# Patient Record
Sex: Female | Born: 1975 | Race: White | Hispanic: No | Marital: Married | Smoking: Former smoker
Health system: Southern US, Community
[De-identification: ages and names within clinical notes are randomized; demographics above are authoritative.]

## PROBLEM LIST (undated history)

## (undated) DIAGNOSIS — G43909 Migraine, unspecified, not intractable, without status migrainosus: Secondary | ICD-10-CM

## (undated) DIAGNOSIS — K219 Gastro-esophageal reflux disease without esophagitis: Secondary | ICD-10-CM

## (undated) DIAGNOSIS — G901 Familial dysautonomia [Riley-Day]: Secondary | ICD-10-CM

## (undated) HISTORY — DX: Gastro-esophageal reflux disease without esophagitis: K21.9

## (undated) HISTORY — PX: TONSILLECTOMY: SUR1361

## (undated) HISTORY — DX: Familial dysautonomia (riley-day): G90.1

## (undated) HISTORY — PX: ENDOMETRIAL ABLATION: SHX621

---

## 2007-09-30 ENCOUNTER — Ambulatory Visit: Payer: Self-pay | Admitting: Internal Medicine

## 2011-04-28 DIAGNOSIS — K219 Gastro-esophageal reflux disease without esophagitis: Secondary | ICD-10-CM | POA: Insufficient documentation

## 2011-04-28 DIAGNOSIS — G43009 Migraine without aura, not intractable, without status migrainosus: Secondary | ICD-10-CM | POA: Insufficient documentation

## 2011-04-28 DIAGNOSIS — M533 Sacrococcygeal disorders, not elsewhere classified: Secondary | ICD-10-CM | POA: Insufficient documentation

## 2014-01-26 DIAGNOSIS — Z7689 Persons encountering health services in other specified circumstances: Secondary | ICD-10-CM | POA: Insufficient documentation

## 2014-02-03 DIAGNOSIS — Z720 Tobacco use: Secondary | ICD-10-CM | POA: Insufficient documentation

## 2014-02-03 DIAGNOSIS — R079 Chest pain, unspecified: Secondary | ICD-10-CM | POA: Insufficient documentation

## 2016-06-16 DIAGNOSIS — M545 Low back pain, unspecified: Secondary | ICD-10-CM | POA: Insufficient documentation

## 2017-07-14 DIAGNOSIS — M25861 Other specified joint disorders, right knee: Secondary | ICD-10-CM | POA: Insufficient documentation

## 2020-01-10 ENCOUNTER — Ambulatory Visit
Admission: EM | Admit: 2020-01-10 | Discharge: 2020-01-10 | Disposition: A | Discharge status: Home / Self Care | Payer: 59 | Attending: Urgent Care | Admitting: Urgent Care

## 2020-01-10 ENCOUNTER — Other Ambulatory Visit: Payer: Self-pay

## 2020-01-10 ENCOUNTER — Ambulatory Visit (INDEPENDENT_AMBULATORY_CARE_PROVIDER_SITE_OTHER): Payer: 59

## 2020-01-10 DIAGNOSIS — M25561 Pain in right knee: Secondary | ICD-10-CM

## 2020-01-10 DIAGNOSIS — W108XXA Fall (on) (from) other stairs and steps, initial encounter: Secondary | ICD-10-CM

## 2020-01-10 DIAGNOSIS — M25461 Effusion, right knee: Secondary | ICD-10-CM

## 2020-01-10 HISTORY — DX: Migraine, unspecified, not intractable, without status migrainosus: G43.909

## 2020-01-10 MED ORDER — HYDROCODONE-ACETAMINOPHEN 5-325 MG PO TABS: 1.0000 | ORAL_TABLET | Freq: Three times a day (TID) | ORAL | 0 refills | Status: DC | PRN

## 2020-01-10 MED ORDER — MELOXICAM 15 MG PO TABS: 15.0000 mg | ORAL_TABLET | Freq: Every day | ORAL | 0 refills | Status: DC

## 2020-01-10 NOTE — Discharge Instructions (Addendum)
It was very nice seeing you today in clinic. Thank you for entrusting me with your care.   Rest, ice, and elevate your knee. Wear brace and use crutches. Limit weight bearing as much as possible until advised otherwise by orthopedics.   Make arrangements to follow up with orthopedics doctor continuing care and treatment. I have provided you the name and office contact information for an excellent local provider [facility] provider). If your symptoms/condition worsens, please seek follow up care either here or in the ER. Please remember, our [facility] providers are "right here with you" when you need [place].   Again, it was my pleasure to take care of you today. Thank you for choosing our clinic. I hope that you start to feel better quickly.   Quentin Mulling, MSN, APRN, FNP-C, CEN Advanced Practice Provider [facility] [place] [facility]

## 2020-01-10 NOTE — ED Triage Notes (Signed)
Missed a stair and fell.  R knee pain going down leg.  Stabbing and tingling.  Did not hit head, no LOC. No other pain, injuries.

## 2020-01-11 NOTE — ED Provider Notes (Signed)
[place], [place]   Name: Tina Chaney DOB: June 16, 1976 MRN: [id number] CSN: [id number] PCP: Patient, No Pcp Per  Arrival date and time:  01/10/20 1938  Chief Complaint:  Knee Pain (right)  NOTE: Prior to seeing the patient today, I have reviewed the triage nursing documentation and vital signs. Clinical staff has updated patient's PMH/PSHx, current medication list, and drug allergies/intolerances to ensure comprehensive history available to assist in medical decision making.   History:   HPI: Tina Chaney is a 44 y.o. female who presents today with complaints of pain in her knee following a mechanical fall that occurred just PTA. Patient describes that the incident occurred when she was carrying a horse that her mother had painted down the stairs. She notes that the lights were off and it was dark. Patient missed the last step resulting in her falling to the floor. When she fell, patient advising that she injured her knee via a twisting motion. Patient does not report hearing/feeling any sort of pop when she fell. She state, "my Apple watch even went off as a notification of a hard fall". Patient denies hitting her head when she fell; no LOC. PMH is not significant for any previous knee injuries or surgeries. Patient presents with generalized pain and swelling in her knee. She describes a distally radiating pain from the knee into the lower extremity that is sharp and stabbing in nature. Due to the acute nature of the events leading to today's urgent care visit, patient has not taken any over the counter interventions to help with her pain.   Past Medical History:  Diagnosis Date  . Migraines     Past Surgical History:  Procedure Laterality Date  . ENDOMETRIAL ABLATION    . TONSILLECTOMY      Family History  Problem Relation Age of Onset  . Diabetes Father   . Renal Disease Father     Social History   Tobacco Use  . Smoking status: Never Smoker  . Smokeless tobacco: Never  Used  Substance Use Topics  . Alcohol use: Never  . Drug use: Never    There are no problems to display for this patient.   Home Medications:    Current Meds  Medication Sig  . Galcanezumab-gnlm 120 MG/ML SOSY Inject into the skin every 30 (thirty) days.  . SUMAtriptan (IMITREX) 100 MG tablet Take 100 mg by mouth every 2 (two) hours as needed for migraine. May repeat in 2 hours if headache persists or recurs.    Allergies:   Patient has no known allergies.  Review of Systems (ROS):  Review of systems NEGATIVE unless otherwise noted in narrative H&P section.   Vital Signs: Today's Vitals   01/10/20 2001 01/10/20 2006 01/10/20 2036  BP:  (!) 128/108   Pulse:  (!) 106   Resp:  18   Temp:  98.1 F (36.7 C)   TempSrc:  Oral   SpO2:  100%   PainSc: 9   9     Physical Exam: Physical Exam  Constitutional: She is oriented to person, place, and time. She appears distressed (mild 2/2 acute pain).  HENT:  Head: Normocephalic and atraumatic.  Eyes: Pupils are equal, round, and reactive to light.  Cardiovascular: Intact distal pulses. Tachycardia present.  Pulmonary/Chest: Effort normal. No respiratory distress.  Musculoskeletal:     Right knee: Swelling and effusion present. No deformity or ecchymosis. Decreased range of motion. Tenderness (generalized) present. No LCL laxity or MCL laxity. Normal alignment  and normal patellar mobility.     Comments: (+) PMS noted distally; color, temperature, and capillary refill all WNL.   Neurological: She is alert and oriented to person, place, and time. She has normal sensation, normal strength and normal reflexes. Gait (2/2 acute injury) abnormal.  Skin: Skin is warm and dry. No rash noted. She is not diaphoretic.  Psychiatric: Memory, affect and judgment normal. Her mood appears anxious (2/2 acute pain).  Nursing note and vitals reviewed.   Urgent Care Treatments / Results:   Orders Placed This Encounter  Procedures  . DG Knee  Complete 4 Views Right    LABS: PLEASE NOTE: all labs that were ordered this encounter are listed, however only abnormal results are displayed. Labs Reviewed - No data to display  EKG: -None  RADIOLOGY: DG Knee Complete 4 Views Right  Result Date: 01/10/2020 CLINICAL DATA:  Twisting injury, swelling, fell EXAM: RIGHT KNEE - COMPLETE 4+ VIEW COMPARISON:  None. FINDINGS: Frontal, bilateral oblique, lateral views of the right knee are obtained. Mild medial compartmental joint space narrowing. No fracture, subluxation, or dislocation. Large suprapatellar joint effusion. Remaining soft tissues are unremarkable. IMPRESSION: 1. Large joint effusion. 2. Mild medial compartmental joint space narrowing. Electronically Signed   By: Sharlet Salina M.D.   On: 01/10/2020 19:57    PROCEDURES: Procedures  MEDICATIONS RECEIVED THIS VISIT: Medications - No data to display  PERTINENT CLINICAL COURSE NOTES/UPDATES:   Initial Impression / Assessment and Plan / Urgent Care Course:  Pertinent labs & imaging results that were available during my care of the patient were personally reviewed by me and considered in my medical decision making (see lab/imaging section of note for values and interpretations).  Tina Chaney is a 44 y.o. female who presents to [facility] today with complaints of Knee Pain (right)  Patient is well appearing overall in clinic today. She does not appear to be in any acute distress. Presenting symptoms (see HPI) and exam as documented above. Exam reveals painful ROM in the RIGHT knee following accidental fall. No deformities notes. Patient grossly NVI. Diagnostic radiographs of the RIGHT knee revealed no acute fracture or dislocation. There is a large suprapatellar effusion noted on the plain films. Discussed with patient that the absence of osseous injuries does not rule out a more serious problem of a ligamentous or cartilaginous etiology. Discussed plan of care for  treatment of her acute knee injury as follows:   Patient placed in reactive knee sleeve in order to provide stability and support. Discussed to limit weight bearing. Patient provided with crutches.    Will pursue treatment using anti-inflammatory medication (meloxicam 15 mg) daily.     She was educated on complimentary modalities to help with her pain. Patient encouraged to rest, ice, and elevate the knee as much as possible. Advised that ice should be applied at lead TID-QID for 15-20 minutes at a time.    Pain is severe and stands to continue to worsen as patient performs her normal ADL/IADLs. Will provide a short course of Norco 5/325 mg tablets for PRN use for more significant pain. Indications and side effects discussed.    Patient needs to be seen for further evaluation by orthopedics. Patient is going to require further treatment for her knee, which may include +/- additional imaging (MRI) and +/- arthrocentesis as deemed appropriate by her orthopedic specialists.  Name and office contact information provided on today's AVS for Dr. Kennedy Bucker. Patient advised the she will need to  contact the office to schedule an appointment to be seen.   Current clinical condition warrants patient being out of work in order to recover from her current injury/illness. 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 Her role undoubtedly requires her to do a great deal of ambulating in order to care for her patients. With her current pain level, coupled with the ordered knee brace and crutches, this is simply not possible. She was provided with the appropriate documentation to provide to her place of employment that will allow for her to RTW on 01/16/2020. Additional time off and/or RTW with restrictions may be required based on recovery from her current injury. Need for additional time or RTW restrictions to be determined by orthopedic provider at time of consult.    I have  reviewed the follow up and strict return precautions for any new or worsening symptoms. Patient is aware of symptoms that would be deemed urgent/emergent, and would thus require further evaluation either here or in the emergency department. At the time of discharge, she verbalized understanding and consent with the discharge plan as it was reviewed with her. All questions were fielded by provider and/or clinic staff prior to patient discharge.    Final Clinical Impressions / Urgent Care Diagnoses:   Final diagnoses:  Acute pain of right knee  Knee effusion, right  Fall (on) (from) other stairs and steps, initial encounter    New Prescriptions:  [place] Controlled Substance Registry consulted? Yes, I have consulted the [place] Controlled Substances Registry for this patient, and feel the risk/benefit ratio today is favorable for proceeding with this prescription for a controlled substance.  . Discussed use of controlled substance medication to treat her acute pain.  o Reviewed [place] STOP Act regulations  o Clinic does not refill controlled substances over the phone without face to face evaluation.  . Safety precautions reviewed.  o Medications should not be bitten, chewed, sold, or taken with alcohol.  o Avoid use while working, driving, or operating heavy machinery.  o Side effects associated with the use of this particular medication reviewed. - Patient understands that this medication can cause CNS depression, increase her risk of falls, and even lead to overdose that may result in death, if used outside of the parameters that she and I discussed.  With all of this in mind, she knowingly accepts the risks and responsibilities associated with intended course of treatment, and elects to responsibly proceed as discussed.  Meds ordered this encounter  Medications  . meloxicam (MOBIC) 15 MG tablet    Sig: Take 1 tablet (15 mg total) by mouth daily.    Dispense:  30 tablet    Refill:  0  .  HYDROcodone-acetaminophen (NORCO) 5-325 MG tablet    Sig: Take 1 tablet by mouth 3 (three) times daily as needed for moderate pain.    Dispense:  12 tablet    Refill:  0    Recommended Follow up Care:  Patient encouraged to follow up with the following provider within the specified time frame, or sooner as dictated by the severity of her symptoms. As always, she was instructed that for any urgent/emergent care needs, she should seek care either here or in the emergency department for more immediate evaluation.  Follow-up Information    Call  Hessie Knows, MD.   Specialty: Orthopedic Surgery Why: Need to be seen for further evaluation and possible drainage of effusion. Contact information: [facility] [address] [address] [phone]         NOTE: This note was prepared using Dragon dictation software along with smaller phrase technology. Despite my best ability to proofread, there is the potential that transcriptional errors may still occur from this process, and are completely unintentional.    Verlee Monte, NP 01/11/20 2024

## 2020-01-12 DIAGNOSIS — M25561 Pain in right knee: Secondary | ICD-10-CM | POA: Insufficient documentation

## 2020-01-16 DIAGNOSIS — M1711 Unilateral primary osteoarthritis, right knee: Secondary | ICD-10-CM | POA: Insufficient documentation

## 2020-02-06 DIAGNOSIS — S82124D Nondisplaced fracture of lateral condyle of right tibia, subsequent encounter for closed fracture with routine healing: Secondary | ICD-10-CM | POA: Insufficient documentation

## 2021-02-12 ENCOUNTER — Ambulatory Visit
Admission: EM | Admit: 2021-02-12 | Discharge: 2021-02-12 | Disposition: A | Discharge status: Home / Self Care | Payer: BLUE CROSS/BLUE SHIELD | Attending: Sports Medicine | Admitting: Sports Medicine

## 2021-02-12 ENCOUNTER — Other Ambulatory Visit: Payer: Self-pay

## 2021-02-12 DIAGNOSIS — M545 Low back pain, unspecified: Secondary | ICD-10-CM

## 2021-02-12 DIAGNOSIS — M62830 Muscle spasm of back: Secondary | ICD-10-CM

## 2021-02-12 DIAGNOSIS — M259 Joint disorder, unspecified: Secondary | ICD-10-CM | POA: Diagnosis not present

## 2021-02-12 MED ORDER — CYCLOBENZAPRINE HCL 10 MG PO TABS: 10.0000 mg | ORAL_TABLET | Freq: Two times a day (BID) | ORAL | 0 refills | Status: DC | PRN

## 2021-02-12 MED ORDER — PREDNISONE 10 MG (21) PO TBPK: ORAL_TABLET | Freq: Every day | ORAL | 0 refills | Status: DC

## 2021-02-12 NOTE — ED Triage Notes (Signed)
Pt c/o lower back pain for the last 2 1/2 weeks ago, worse on left side. No known injury, no urinary symptoms.

## 2021-02-12 NOTE — Discharge Instructions (Signed)
Please see educational handouts 

## 2021-02-13 NOTE — ED Provider Notes (Signed)
[facility]    CSN: [id number] Arrival date & time: 02/12/21  0626      History   Chief Complaint Chief Complaint  Patient presents with  . Back Pain    HPI Tina Chaney is a 45 y.o. female.   Patient is a pleasant 45 year old female who presents for evaluation of the above issue.  She normally sees [facility] here in [place] but they were unavailable to see her today.  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  She reports 2 to 2-1/2 weeks of left-sided low back pain.  Points over the SI joint as the point of maximal tenderness.  She does have a history of sciatica but it is not going down her leg.  No incontinence of bowel or bladder.  No saddle anesthesia.  She denies accidents trauma falls or twists.  She has been using Aleve ibuprofen or Tylenol as needed.  She does have a history of migraine headaches and takes Imitrex as needed.  She denies any abdominal or urinary symptoms.  She denies any thoracic back pain or cervical pain.  Again no pain going down her legs or arms.  She is not dropping any objects.  No weakness or foot drop.  No chest pain or shortness of breath.  No red flag signs or symptoms elicited on history.     Past Medical History:  Diagnosis Date  . Migraines     There are no problems to display for this patient.   Past Surgical History:  Procedure Laterality Date  . ENDOMETRIAL ABLATION    . TONSILLECTOMY      OB History   No obstetric history on file.      Home Medications    Prior to Admission medications   Medication Sig Start Date End Date Taking? Authorizing Provider  cyclobenzaprine (FLEXERIL) 10 MG tablet Take 1 tablet (10 mg total) by mouth 2 (two) times daily as needed for muscle spasms. 02/12/21  Yes Delton See, MD  predniSONE (STERAPRED UNI-PAK 21 TAB) 10 MG (21) TBPK tablet Take by mouth daily. Take 6 tabs by mouth daily  for 2 days, then 5 tabs for 2 days, then 4 tabs for 2 days, then 3 tabs  for 2 days, 2 tabs for 2 days, then 1 tab by mouth daily for 2 days 02/12/21  Yes Delton See, MD  SUMAtriptan (IMITREX) 100 MG tablet Take 100 mg by mouth every 2 (two) hours as needed for migraine. May repeat in 2 hours if headache persists or recurs.   Yes [provider]  Galcanezumab-gnlm 120 MG/ML SOSY Inject into the skin every 30 (thirty) days.    [provider]  HYDROcodone-acetaminophen (NORCO) 5-325 MG tablet Take 1 tablet by mouth 3 (three) times daily as needed for moderate pain. 01/10/20   Verlee Monte, NP  meloxicam (MOBIC) 15 MG tablet Take 1 tablet (15 mg total) by mouth daily. 01/10/20   Verlee Monte, NP    Family History Family History  Problem Relation Age of Onset  . Diabetes Father   . Renal Disease Father     Social History Social History   Tobacco Use  . Smoking status: Never Smoker  . Smokeless tobacco: Never Used  Substance Use Topics  . Alcohol use: Never  . Drug use: Never     Allergies   Patient has no known allergies.   Review of Systems Review of Systems  Constitutional: Positive for activity  change. Negative for appetite change, chills, diaphoresis, fatigue and fever.  HENT: Negative.  Negative for congestion.   Eyes: Negative.  Negative for pain.  Respiratory: Negative.  Negative for cough.   Cardiovascular: Negative.  Negative for chest pain and palpitations.  Gastrointestinal: Negative for abdominal pain, diarrhea, nausea and vomiting.  Genitourinary: Negative for dysuria, flank pain, frequency, hematuria and urgency.  Musculoskeletal: Positive for back pain and gait problem. Negative for arthralgias, myalgias, neck pain and neck stiffness.  Skin: Negative.  Negative for color change, pallor, rash and wound.  Neurological: Negative for dizziness, syncope, weakness, light-headedness, numbness and headaches.  All other systems reviewed and are negative.    Physical Exam Triage Vital Signs ED Triage Vitals  Enc  Vitals Group     BP 02/12/21 0936 135/65     Pulse Rate 02/12/21 0936 97     Resp 02/12/21 0936 16     Temp 02/12/21 0936 98.2 F (36.8 C)     Temp Source 02/12/21 0936 Oral     SpO2 02/12/21 0936 100 %     Weight 02/12/21 0936 180 lb (81.6 kg)     Height 02/12/21 0936 5\' 8"  (1.727 m)     Head Circumference --      Peak Flow --      Pain Score 02/12/21 0935 7     Pain Loc --      Pain Edu? --      Excl. in GC? --    No data found.  Updated Vital Signs BP 135/65 (BP Location: Left Arm)   Pulse 97   Temp 98.2 F (36.8 C) (Oral)   Resp 16   Ht 5\' 8"  (1.727 m)   Wt 81.6 kg   SpO2 100%   BMI 27.37 kg/m   Visual Acuity Right Eye Distance:   Left Eye Distance:   Bilateral Distance:    Right Eye Near:   Left Eye Near:    Bilateral Near:     Physical Exam Vitals and nursing note reviewed.  Constitutional:      General: She is not in acute distress.    Appearance: Normal appearance. She is not ill-appearing, toxic-appearing or diaphoretic.  HENT:     Head: Normocephalic and atraumatic.     Nose: Nose normal.     Mouth/Throat:     Mouth: Mucous membranes are moist.  Eyes:     General: No scleral icterus.       Right eye: No discharge.        Left eye: No discharge.     Conjunctiva/sclera: Conjunctivae normal.  Cardiovascular:     Rate and Rhythm: Normal rate and regular rhythm.     Pulses: Normal pulses.     Heart sounds: Normal heart sounds. No murmur heard. No friction rub. No gallop.   Pulmonary:     Effort: Pulmonary effort is normal.     Breath sounds: Normal breath sounds. No stridor. No wheezing, rhonchi or rales.  Musculoskeletal:     Cervical back: Normal.     Thoracic back: Normal.     Lumbar back: Spasms and tenderness present. No swelling, edema, deformity, signs of trauma, lacerations or bony tenderness. Decreased range of motion. Negative right straight leg raise test and negative left straight leg raise test. No scoliosis.     Comments: Lumbar  spine: No obvious bony abnormality, ecchymosis, erythema.  No soft tissue swelling.  There is tenderness palpation over both SI joints but more pronounced on  the left side.  Negative straight leg raise bilaterally.  Patient is uncomfortable lifting her left leg and extending her hip.  That said there is no weakness bilateral lower extremities.  Some mild decreased range of motion in all planes secondary to discomfort.  Patient actually feels better with back extension.  There is some mild spasm in the paraspinous muscles of the lumbar spine bilaterally.  2+ deep tendon reflexes at the Achilles and patellar tendon bilaterally.  There is no focal weakness bilateral lower extremities.  Skin:    General: Skin is warm and dry.     Capillary Refill: Capillary refill takes less than 2 seconds.     Findings: No bruising, erythema, lesion or rash.  Neurological:     General: No focal deficit present.     Mental Status: She is alert and oriented to person, place, and time.     Sensory: No sensory deficit.     Motor: No weakness.     Coordination: Coordination normal.     Deep Tendon Reflexes: Reflexes normal.      UC Treatments / Results  Labs (all labs ordered are listed, but only abnormal results are displayed) Labs Reviewed - No data to display  EKG   Radiology No results found.  Procedures Procedures (including critical care time)  Medications Ordered in UC Medications - No data to display  Initial Impression / Assessment and Plan / UC Course  I have reviewed the triage vital signs and the nursing notes.  Pertinent labs & imaging results that were available during my care of the patient were reviewed by me and considered in my medical decision making (see chart for details).  Clinical impression: 45 year old female with 2 to 2-1/2 weeks of left-sided low back pain without sciatica.  She also has some involvement of the left sacroiliac joint consistent with sacroiliitis.  She does  have a history of sciatica but no current symptoms.  She also has some muscle spasm noted on examination.  No red flag signs or symptoms elicited on history or physical examination.  Treatment plan: 1.  The findings and treatment plan were discussed in detail with the patient.  Patient was in agreement. 2.  I reassured her that her exam did not concern me for anything that required a higher level of care immediately which would include an ER visit or advanced imaging.  In addition her exam was consistent with mechanical low back pain.  X-rays were not indicated at the present time.  There was no trauma.  She was in agreement with this. 3.  I went ahead and prescribed a prednisone taper to his sister as well as cyclobenzaprine for the muscle spasm. 4.  Educational handouts provided. 5.  I felt she would benefit from seeing orthopedics and I have referred her to [facility] here in [facility] for them to see her in the office. 6.  She probably would benefit from physical therapy as well but that would need to come from orthopedics or her primary care provider as her insurance would not approve a physical therapy referral from urgent care. 7.  We discussed potential chiropractic evaluation as well and she will look into that on her own. 8.  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 9.  Red flag signs and symptoms were discussed in detail and when to seek out immediate medical attention.  She voiced verbal understanding. 10.  She was discharged from care at  this time and she will follow-up with [place] as needed.  She was stable on discharge.    Final Clinical Impressions(s) / UC Diagnoses   Final diagnoses:  Acute left-sided low back pain without sciatica  Muscle spasm of back  Disorder of left sacroiliac joint     Discharge Instructions     Please see educational handouts    ED Prescriptions    Medication Sig Dispense Auth. Provider   predniSONE (STERAPRED  UNI-PAK 21 TAB) 10 MG (21) TBPK tablet Take by mouth daily. Take 6 tabs by mouth daily  for 2 days, then 5 tabs for 2 days, then 4 tabs for 2 days, then 3 tabs for 2 days, 2 tabs for 2 days, then 1 tab by mouth daily for 2 days 42 tablet Delton See, MD   cyclobenzaprine (FLEXERIL) 10 MG tablet Take 1 tablet (10 mg total) by mouth 2 (two) times daily as needed for muscle spasms. 20 tablet Delton See, MD     PDMP not reviewed this encounter.   Delton See, MD 02/15/21 1651

## 2021-05-20 IMAGING — CR DG KNEE COMPLETE 4+V*R*
4 series · 4 of 4 positions shown · non-contrast
Comparison: None.

CLINICAL DATA: Twisting injury, swelling, fell

EXAM:
RIGHT KNEE - COMPLETE 4+ VIEW

[knee ap]
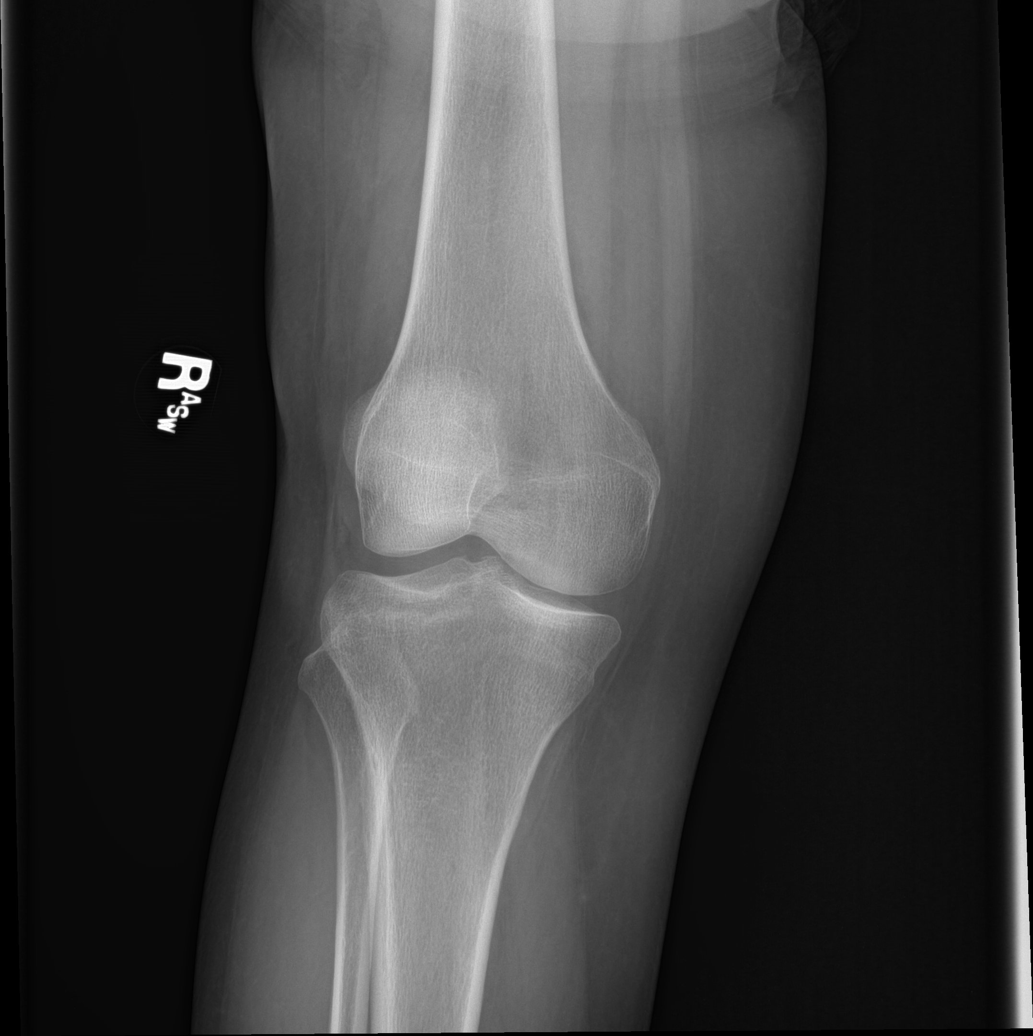

[knee lat]
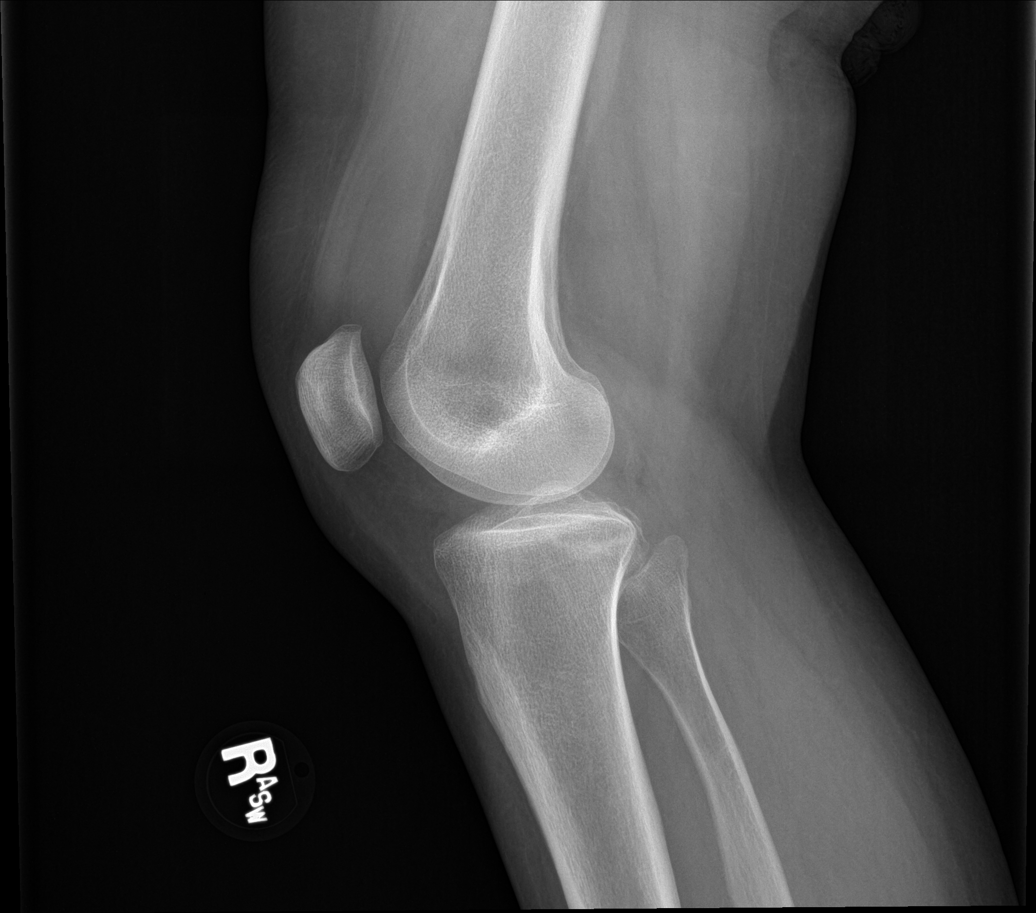

[knee obl (1 of 2)]
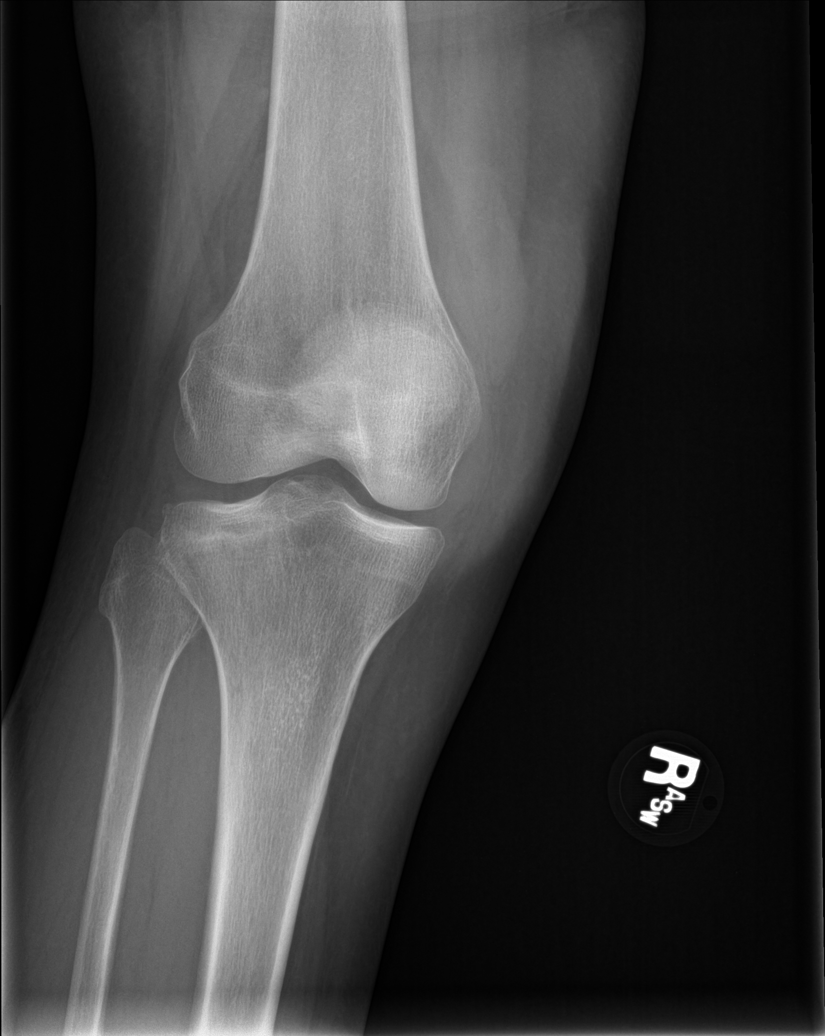

[knee obl (2 of 2)]
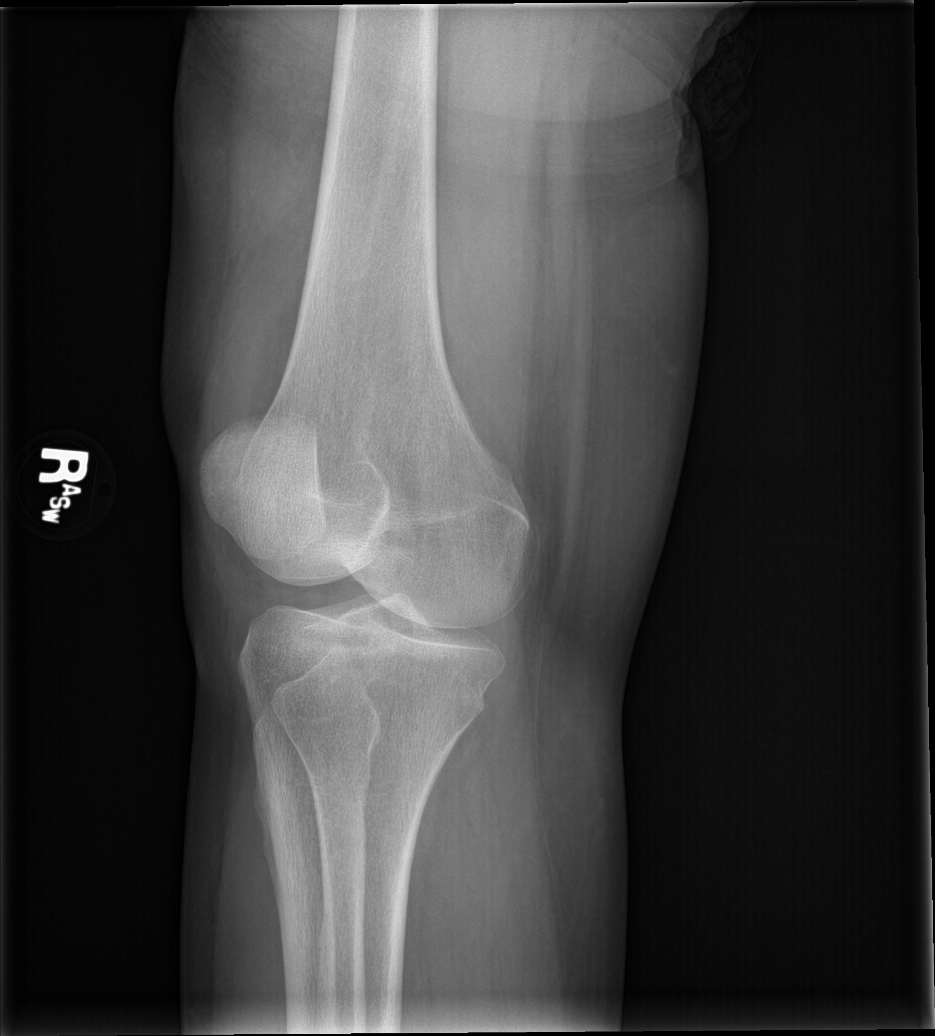

[4 of 4 positions shown; findings below may reference images not displayed]

FINDINGS: Frontal, bilateral oblique, lateral views of the right knee are
obtained. Mild medial compartmental joint space narrowing. No
fracture, subluxation, or dislocation. Large suprapatellar joint
effusion. Remaining soft tissues are unremarkable.
IMPRESSION: 1. Large joint effusion.
2. Mild medial compartmental joint space narrowing.

## 2021-07-11 ENCOUNTER — Ambulatory Visit
Admission: RE | Admit: 2021-07-11 | Discharge: 2021-07-11 | Disposition: A | Discharge status: Home / Self Care | Payer: BLUE CROSS/BLUE SHIELD | Source: Ambulatory Visit | Attending: Emergency Medicine | Admitting: Emergency Medicine

## 2021-07-11 ENCOUNTER — Other Ambulatory Visit: Payer: Self-pay

## 2021-07-11 VITALS — BP 165/103 | HR 89 | Temp 98.3°F | Resp 18 | Ht 68.0 in | Wt 170.0 lb

## 2021-07-11 DIAGNOSIS — H66003 Acute suppurative otitis media without spontaneous rupture of ear drum, bilateral: Secondary | ICD-10-CM | POA: Diagnosis not present

## 2021-07-11 DIAGNOSIS — J069 Acute upper respiratory infection, unspecified: Secondary | ICD-10-CM

## 2021-07-11 MED ORDER — AMOXICILLIN-POT CLAVULANATE 875-125 MG PO TABS: 1.0000 | ORAL_TABLET | Freq: Two times a day (BID) | ORAL | 0 refills | Status: AC

## 2021-07-11 MED ORDER — PROMETHAZINE-DM 6.25-15 MG/5ML PO SYRP: 5.0000 mL | ORAL_SOLUTION | Freq: Four times a day (QID) | ORAL | 0 refills | Status: DC | PRN

## 2021-07-11 MED ORDER — BENZONATATE 100 MG PO CAPS: 200.0000 mg | ORAL_CAPSULE | Freq: Three times a day (TID) | ORAL | 0 refills | Status: DC

## 2021-07-11 NOTE — ED Provider Notes (Signed)
[facility]    CSN: [id number] Arrival date & time: 07/11/21  1738      History   Chief Complaint Chief Complaint  Patient presents with   Cough    HPI Tina Chaney is a 45 y.o. female.   HPI  Present-year-old female here for evaluation of cough.  Patient reports that she has been experiencing a productive cough for the last 8 days.  This is been in conjunction with runny nose, nasal congestion, bilateral ear pain, shortness breath wheezing, and nausea.  She states that she is exhibiting some yellow nasal discharge and her productive cough is a mixture of yellow and brown.  She denies any fever, vomiting, diarrhea, or body aches.  She states that she tested for COVID prior to flying to [place] for the weekend and when she landed she had severe stabbing pain in her right ear.  She reports that she is continuing to have the pain.  She had a sore throat at the onset of symptoms but that has resolved.  Past Medical History:  Diagnosis Date   Migraines     There are no problems to display for this patient.   Past Surgical History:  Procedure Laterality Date   ENDOMETRIAL ABLATION     TONSILLECTOMY      OB History   No obstetric history on file.      Home Medications    Prior to Admission medications   Medication Sig Start Date End Date Taking? Authorizing Provider  amoxicillin-clavulanate (AUGMENTIN) 875-125 MG tablet Take 1 tablet by mouth every 12 (twelve) hours for 10 days. 07/11/21 07/21/21 Yes Becky Augusta, NP  benzonatate (TESSALON) 100 MG capsule Take 2 capsules (200 mg total) by mouth every 8 (eight) hours. 07/11/21  Yes Becky Augusta, NP  promethazine-dextromethorphan (PROMETHAZINE-DM) 6.25-15 MG/5ML syrup Take 5 mLs by mouth 4 (four) times daily as needed. 07/11/21  Yes Becky Augusta, NP  cyclobenzaprine (FLEXERIL) 10 MG tablet Take 1 tablet (10 mg total) by mouth 2 (two) times daily as needed for muscle spasms. 02/12/21   Delton See, MD   Galcanezumab-gnlm 120 MG/ML SOSY Inject into the skin every 30 (thirty) days.    [provider]  HYDROcodone-acetaminophen (NORCO) 5-325 MG tablet Take 1 tablet by mouth 3 (three) times daily as needed for moderate pain. 01/10/20   Verlee Monte, NP  meloxicam (MOBIC) 15 MG tablet Take 1 tablet (15 mg total) by mouth daily. 01/10/20   Verlee Monte, NP  predniSONE (STERAPRED UNI-PAK 21 TAB) 10 MG (21) TBPK tablet Take by mouth daily. Take 6 tabs by mouth daily  for 2 days, then 5 tabs for 2 days, then 4 tabs for 2 days, then 3 tabs for 2 days, 2 tabs for 2 days, then 1 tab by mouth daily for 2 days 02/12/21   Delton See, MD  SUMAtriptan (IMITREX) 100 MG tablet Take 100 mg by mouth every 2 (two) hours as needed for migraine. May repeat in 2 hours if headache persists or recurs.    [provider]    Family History Family History  Problem Relation Age of Onset   Diabetes Father    Renal Disease Father     Social History Social History   Tobacco Use   Smoking status: Never   Smokeless tobacco: Never  Substance Use Topics   Alcohol use: Never   Drug use: Never     Allergies   Patient has no known allergies.  Review of Systems Review of Systems  Constitutional:  Negative for activity change, appetite change and fever.  HENT:  Positive for congestion, ear pain, rhinorrhea and sore throat.   Respiratory:  Positive for cough, shortness of breath and wheezing.   Gastrointestinal:  Positive for nausea. Negative for diarrhea and vomiting.  Musculoskeletal:  Negative for arthralgias and myalgias.  Skin:  Negative for rash.  Hematological: Negative.   Psychiatric/Behavioral: Negative.      Physical Exam Triage Vital Signs ED Triage Vitals [07/11/21 1815]  Enc Vitals Group     BP (!) 165/103     Pulse Rate 89     Resp 18     Temp 98.3 F (36.8 C)     Temp Source Oral     SpO2 99 %     Weight 170 lb (77.1 kg)     Height 5\' 8"  (1.727 m)     Head  Circumference      Peak Flow      Pain Score 4     Pain Loc      Pain Edu?      Excl. in GC?    No data found.  Updated Vital Signs BP (!) 165/103   Pulse 89   Temp 98.3 F (36.8 C) (Oral)   Resp 18   Ht 5\' 8"  (1.727 m)   Wt 170 lb (77.1 kg)   SpO2 99%   BMI 25.85 kg/m   Visual Acuity Right Eye Distance:   Left Eye Distance:   Bilateral Distance:    Right Eye Near:   Left Eye Near:    Bilateral Near:     Physical Exam Vitals and nursing note reviewed.  Constitutional:      General: She is not in acute distress.    Appearance: Normal appearance. She is normal weight. She is not ill-appearing.  HENT:     Head: Normocephalic and atraumatic.     Right Ear: Ear canal and external ear normal.     Left Ear: Ear canal and external ear normal.     Nose: Congestion and rhinorrhea present.     Mouth/Throat:     Mouth: Mucous membranes are moist.     Pharynx: Oropharynx is clear. Posterior oropharyngeal erythema present.  Cardiovascular:     Rate and Rhythm: Normal rate and regular rhythm.     Pulses: Normal pulses.     Heart sounds: Normal heart sounds. No murmur heard.   No gallop.  Pulmonary:     Effort: Pulmonary effort is normal.     Breath sounds: Normal breath sounds. No wheezing, rhonchi or rales.  Musculoskeletal:     Cervical back: Normal range of motion and neck supple.  Lymphadenopathy:     Cervical: No cervical adenopathy.  Skin:    General: Skin is warm and dry.     Capillary Refill: Capillary refill takes less than 2 seconds.     Findings: No erythema or rash.  Neurological:     General: No focal deficit present.     Mental Status: She is oriented to person, place, and time.  Psychiatric:        Mood and Affect: Mood normal.        Behavior: Behavior normal.        Thought Content: Thought content normal.        Judgment: Judgment normal.     UC Treatments / Results  Labs (all labs ordered are listed, but only abnormal results are  displayed) Labs Reviewed - No data to display  EKG   Radiology No results found.  Procedures Procedures (including critical care time)  Medications Ordered in UC Medications - No data to display  Initial Impression / Assessment and Plan / UC Course  I have reviewed the triage vital signs and the nursing notes.  Pertinent labs & imaging results that were available during my care of the patient were reviewed by me and considered in my medical decision making (see chart for details).  Patient is a very pleasant, nontoxic-appearing 45 year old female here for evaluation of respiratory complaints as outlined in HPI above that been present for last 8 days.  Patient's physical exam reveals erythematous cyst, bulging, and injected tympanic membranes bilaterally with the right being worse than left.  Both external auditory canals are clear.  Nasal mucosa is erythematous edematous with yellow nasal discharge in both nares.  There is posterior oropharyngeal erythema with yellow postnasal drip.  No cervical lymphadenopathy appreciated on exam.  Cardiopulmonary exam reveals clear lung sounds in all fields.  There is concern for bilateral otitis media versus barotrauma.  Will cover with Augmentin 875 twice daily for 10 days.  Patient offered Atrovent nasal spray to help with nasal congestion but she declined.  She did except Tessalon Perles and Promethazine DM to help with her cough symptoms.  Patient vies to return for new or worsening symptoms.   Final Clinical Impressions(s) / UC Diagnoses   Final diagnoses:  Upper respiratory tract infection, unspecified type  Non-recurrent acute suppurative otitis media of both ears without spontaneous rupture of tympanic membranes     Discharge Instructions      Take the Augmentin twice daily for 10 days with food for treatment of your ear infection.  Take an over-the-counter probiotic 1 hour after each dose of antibiotic to prevent diarrhea.  Use  over-the-counter Tylenol and ibuprofen as needed for pain or fever.  Place a hot water bottle, or heating pad, underneath your pillowcase at night to help dilate up your ear and aid in pain relief as well as resolution of the infection.  Use the Tessalon perles during the day as needed for cough and the Promethazine DM cough syrup at bedtime.  Return for reevaluation for any new or worsening symptoms.      ED Prescriptions     Medication Sig Dispense Auth. Provider   amoxicillin-clavulanate (AUGMENTIN) 875-125 MG tablet Take 1 tablet by mouth every 12 (twelve) hours for 10 days. 20 tablet Becky Augusta, NP   benzonatate (TESSALON) 100 MG capsule Take 2 capsules (200 mg total) by mouth every 8 (eight) hours. 21 capsule Becky Augusta, NP   promethazine-dextromethorphan (PROMETHAZINE-DM) 6.25-15 MG/5ML syrup Take 5 mLs by mouth 4 (four) times daily as needed. 118 mL Becky Augusta, NP      PDMP not reviewed this encounter.   Becky Augusta, NP 07/11/21 [phone]

## 2021-07-11 NOTE — Discharge Instructions (Addendum)
Take the Augmentin twice daily for 10 days with food for treatment of your ear infection.  Take an over-the-counter probiotic 1 hour after each dose of antibiotic to prevent diarrhea.  Use over-the-counter Tylenol and ibuprofen as needed for pain or fever.  Place a hot water bottle, or heating pad, underneath your pillowcase at night to help dilate up your ear and aid in pain relief as well as resolution of the infection.  Use the Tessalon perles during the day as needed for cough and the Promethazine DM cough syrup at bedtime.  Return for reevaluation for any new or worsening symptoms.

## 2021-07-11 NOTE — ED Triage Notes (Signed)
Pt reports having a productive cough since last Wednesday. Neg home covid test on Thursday.

## 2022-01-31 ENCOUNTER — Emergency Department
Admission: EM | Admit: 2022-01-31 | Discharge: 2022-02-01 | Disposition: A | Discharge status: Home / Self Care | Payer: 59 | Attending: Emergency Medicine | Admitting: Emergency Medicine

## 2022-01-31 ENCOUNTER — Ambulatory Visit (INDEPENDENT_AMBULATORY_CARE_PROVIDER_SITE_OTHER): Payer: 59

## 2022-01-31 ENCOUNTER — Other Ambulatory Visit: Payer: Self-pay

## 2022-01-31 ENCOUNTER — Ambulatory Visit
Admission: EM | Admit: 2022-01-31 | Discharge: 2022-01-31 | Disposition: A | Discharge status: Home / Self Care | Payer: 59

## 2022-01-31 DIAGNOSIS — R059 Cough, unspecified: Secondary | ICD-10-CM | POA: Diagnosis not present

## 2022-01-31 DIAGNOSIS — R091 Pleurisy: Secondary | ICD-10-CM | POA: Insufficient documentation

## 2022-01-31 DIAGNOSIS — R051 Acute cough: Secondary | ICD-10-CM | POA: Diagnosis not present

## 2022-01-31 DIAGNOSIS — R0602 Shortness of breath: Secondary | ICD-10-CM | POA: Diagnosis not present

## 2022-01-31 DIAGNOSIS — M549 Dorsalgia, unspecified: Secondary | ICD-10-CM | POA: Insufficient documentation

## 2022-01-31 DIAGNOSIS — R079 Chest pain, unspecified: Secondary | ICD-10-CM | POA: Diagnosis present

## 2022-01-31 DIAGNOSIS — U071 COVID-19: Secondary | ICD-10-CM

## 2022-01-31 DIAGNOSIS — R0981 Nasal congestion: Secondary | ICD-10-CM | POA: Diagnosis not present

## 2022-01-31 LAB — CBC
HCT: 44.2 % (ref 36.0–46.0)
Hemoglobin: 14.2 g/dL (ref 12.0–15.0)
MCH: 28.1 pg (ref 26.0–34.0)
MCHC: 32.1 g/dL (ref 30.0–36.0)
MCV: 87.4 fL (ref 80.0–100.0)
Platelets: 357 10*3/uL (ref 150–400)
RBC: 5.06 MIL/uL (ref 3.87–5.11)
RDW: 14.4 % (ref 11.5–15.5)
WBC: 9.1 10*3/uL (ref 4.0–10.5)
nRBC: 0 % (ref 0.0–0.2)

## 2022-01-31 LAB — BASIC METABOLIC PANEL
Anion gap: 9 (ref 5–15)
BUN: 14 mg/dL (ref 6–20)
CO2: 25 mmol/L (ref 22–32)
Calcium: 9.4 mg/dL (ref 8.9–10.3)
Chloride: 105 mmol/L (ref 98–111)
Creatinine, Ser: 0.58 mg/dL (ref 0.44–1.00)
GFR, Estimated: >60 mL/min (ref >60)
Glucose, Bld: 162 mg/dL — ABNORMAL HIGH (ref 70–99)
Potassium: 4.6 mmol/L (ref 3.5–5.1)
Sodium: 139 mmol/L (ref 135–145)

## 2022-01-31 LAB — TROPONIN I (HIGH SENSITIVITY): Troponin I (High Sensitivity): 4 ng/L (ref <18)

## 2022-01-31 MED ORDER — PREDNISONE 10 MG (21) PO TBPK: ORAL_TABLET | ORAL | Status: DC

## 2022-01-31 MED ORDER — AZITHROMYCIN 250 MG PO TABS: ORAL_TABLET | ORAL | 0 refills | Status: DC

## 2022-01-31 MED ORDER — CEFPODOXIME PROXETIL 200 MG PO TABS: 200.0000 mg | ORAL_TABLET | Freq: Two times a day (BID) | ORAL | 0 refills | Status: AC

## 2022-01-31 NOTE — ED Provider Notes (Signed)
? ?[facility] ?Provider Note ? ? ? Event Date/Time  ? First MD Initiated Contact with Patient 01/31/22 2353   ?  (approximate) ? ? ?History  ? ?Chest Pain and Shortness of Breath ? ? ?HPI ? ?Tina Chaney is a 46 y.o. female who presents to the ED from home with a chief complaint of shortness of breath, chest and back pain.  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  She has had mild symptoms of cough and congestion.  Seen at urgent care earlier today with negative chest x-ray, placed on steroid taper, Vantin and Zithromax.  States her symptoms worsened and she is concern for pulmonary embolus.  Denies fever, chills, shortness of breath, abdominal pain, nausea, vomiting or dizziness. ?  ? ? ?Past Medical History  ? ?Past Medical History:  ?Diagnosis Date  ? Migraines   ? ? ? ?Active Problem List  ? ?Patient Active Problem List  ? Diagnosis Date Noted  ? Nondisplaced fracture of lateral condyle of right tibia, subsequent encounter for closed fracture with routine healing 02/06/2020  ? Osteoarthritis of right patellofemoral joint 01/16/2020  ? Acute pain of right knee 01/12/2020  ? Patellofemoral dysfunction of right knee 07/14/2017  ? Acute left-sided low back pain without sciatica 06/16/2016  ? Tobacco abuse 02/03/2014  ? Chest pain 02/03/2014  ? Referral of patient 01/26/2014  ? SI (sacroiliac) joint dysfunction 04/28/2011  ? Migraine without aura and without status migrainosus, not intractable 04/28/2011  ? GERD (gastroesophageal reflux disease) 04/28/2011  ? ? ? ?Past Surgical History  ? ?Past Surgical History:  ?Procedure Laterality Date  ? ENDOMETRIAL ABLATION    ? TONSILLECTOMY    ? ? ? ?Home Medications  ? ?Prior to Admission medications   ?Medication Sig Start Date End Date Taking? Authorizing Provider  ?albuterol (VENTOLIN HFA) 108 (90 Base) MCG/ACT inhaler Inhale 2 puffs  into the lungs every 4 (four) hours as needed for wheezing or shortness of breath. 02/01/22  Yes Irean Hong, MD  ?HYDROcodone-acetaminophen (NORCO) 5-325 MG tablet Take 1 tablet by mouth every 6 (six) hours as needed for moderate pain. 02/01/22  Yes Irean Hong, MD  ?azithromycin (ZITHROMAX) 250 MG tablet Take 2 tablets the first day.  Take 1 tablet on days 2 through 5. 01/31/22   Orvil Feil, PA-C  ?benzonatate (TESSALON) 100 MG capsule Take 2 capsules (200 mg total) by mouth every 8 (eight) hours. 07/11/21   Becky Augusta, NP  ?cefpodoxime (VANTIN) 200 MG tablet Take 1 tablet (200 mg total) by mouth 2 (two) times daily for 7 days. 01/31/22 02/07/22  Orvil Feil, PA-C  ?Cetirizine HCl 10 MG CAPS Take by mouth.    [provider]  ?cyclobenzaprine (FLEXERIL) 10 MG tablet Take 1 tablet (10 mg total) by mouth 2 (two) times daily as needed for muscle spasms. 02/12/21   Delton See, MD  ?dextromethorphan-guaiFENesin [facility] DM) 30-600 MG 12hr tablet Take 1 tablet by mouth 2 (two) times daily.    [provider]  ?Galcanezumab-gnlm 120 MG/ML SOSY Inject into the skin every 30 (thirty) days.    [provider]  ?meloxicam (MOBIC) 15 MG tablet Take 1 tablet (15 mg total) by mouth daily. 01/10/20   Verlee Monte, NP  ?oxyCODONE (OXY IR/ROXICODONE) 5 MG immediate release tablet Take by mouth. 01/16/20   [provider]  ?predniSONE Albesa Seen  UNI-PAK 21 TAB) 10 MG (21) TBPK tablet Take 6 tablets the first day, take 5 tablets the second day, take 4 tablets the third day, take 3 tablets the fourth day, take 2 tablets the fifth day, take 1 tablet the sixth day. 01/31/22   Orvil FeilWoods, Jaclyn M, PA-C  ?promethazine (PHENERGAN) 25 MG tablet Take 1 tablet by mouth every 6 (six) hours as needed. 05/21/19   [provider]  ?promethazine-dextromethorphan (PROMETHAZINE-DM) 6.25-15 MG/5ML syrup Take 5 mLs by mouth 4 (four) times daily as needed. 07/11/21   Becky Augustayan, Jeremy, NP  ?SUMAtriptan (IMITREX)  100 MG tablet Take 100 mg by mouth every 2 (two) hours as needed for migraine. May repeat in 2 hours if headache persists or recurs.    [provider]  ?SUMAtriptan (IMITREX) 100 MG tablet TAKE 1 TABLET (100 MG TOTAL) BY MOUTH AS DIRECTED FOR MIGRAINE MAY REPEAT AFTER 2 HOURS IF NEEDED. 11/15/19   [provider]  ?topiramate (TOPAMAX) 100 MG tablet Take 1 tablet by mouth daily. 10/31/20   [provider]  ?topiramate (TOPAMAX) 50 MG tablet Take 1 tablet by mouth daily. 11/15/19   [provider]  ? ? ? ?Allergies  ?Patient has no known allergies. ? ? ?Family History  ? ?Family History  ?Problem Relation Age of Onset  ? Diabetes Father   ? Renal Disease Father   ? ? ? ?Physical Exam  ?Triage Vital Signs: ?ED Triage Vitals  ?Enc Vitals Group  ?   BP 01/31/22 2251 (!) 159/103  ?   Pulse Rate 01/31/22 2251 95  ?   Resp 01/31/22 2251 18  ?   Temp 01/31/22 2251 97.9 ?F (36.6 ?C)  ?   Temp Source 01/31/22 2251 Oral  ?   SpO2 01/31/22 2251 94 %  ?   Weight 01/31/22 2252 170 lb (77.1 kg)  ?   Height 01/31/22 2252 5\' 8"  (1.727 m)  ?   Head Circumference --   ?   Peak Flow --   ?   Pain Score 01/31/22 2252 4  ?   Pain Loc --   ?   Pain Edu? --   ?   Excl. in GC? --   ? ? ?Updated Vital Signs: ?BP (!) 141/83   Pulse 80   Temp 98.4 ?F (36.9 ?C) (Oral)   Resp 18   Ht 5\' 8"  (1.727 m)   Wt 77.1 kg   LMP  (LMP Unknown)   SpO2 99%   BMI 25.85 kg/m?  ? ? ?General: Awake, no distress.  ?CV:  RRR.  Good peripheral perfusion.  ?Resp:  Normal effort.  CTA B ?Abd:  Nontender no distention.  ?Other:  No swelling or tenderness to calves. ? ? ?ED Results / Procedures / Treatments  ?Labs ?(all labs ordered are listed, but only abnormal results are displayed) ?Labs Reviewed  ?BASIC METABOLIC PANEL - Abnormal; Notable for the following components:  ?    Result Value  ? Glucose, Bld 162 (*)   ? All other components within normal limits  ?CBC  ?D-DIMER, QUANTITATIVE  ?TROPONIN I (HIGH SENSITIVITY)   ?TROPONIN I (HIGH SENSITIVITY)  ? ? ? ?EKG ? ?ED ECG REPORT ?I, Irean HongSUNG,Rachna Schonberger J, the attending physician, personally viewed and interpreted this ECG. ? ? Date: 02/01/2022 ? EKG Time: 2251 ? Rate: 92 ? Rhythm: normal sinus rhythm ? Axis: Normal ? Intervals:none ? ST&T Change: Nonspecific ? ? ? ?RADIOLOGY ?I have independently visualized and interpreted patient's chest x-ray  as well as noted the radiology interpretation: ? ?Chest x-ray: No acute cardiopulmonary process ? ?CTA chest: No PE ? ?Official radiology report(s): ?DG Chest 2 View ? ?Result Date: 01/31/2022 ?CLINICAL DATA:  Shortness of breath COVID positive. EXAM: CHEST - 2 VIEW COMPARISON:  None. FINDINGS: The heart size and mediastinal contours are within normal limits. Both lungs are clear. The visualized skeletal structures are unremarkable. IMPRESSION: No acute cardiopulmonary disease. Electronically Signed   By: Maudry Mayhew M.D.   On: 01/31/2022 10:13  ? ?CT Angio Chest PE W/Cm &/Or Wo Cm ? ?Result Date: 02/01/2022 ?CLINICAL DATA:  Shortness of breath history of COVID EXAM: CT ANGIOGRAPHY CHEST WITH CONTRAST TECHNIQUE: Multidetector CT imaging of the chest was performed using the standard protocol during bolus administration of intravenous contrast. Multiplanar CT image reconstructions and MIPs were obtained to evaluate the vascular anatomy. RADIATION DOSE REDUCTION: This exam was performed according to the departmental dose-optimization program which includes automated exposure control, adjustment of the mA and/or kV according to patient size and/or use of iterative reconstruction technique. CONTRAST:  67mL OMNIPAQUE IOHEXOL 350 MG/ML SOLN COMPARISON:  Chest x-ray 01/31/2022 FINDINGS: Cardiovascular: Satisfactory opacification of the pulmonary arteries to the segmental level. No evidence of pulmonary embolism. Normal heart size. No pericardial effusion. Nonaneurysmal aorta. No dissection is seen. Mediastinum/Nodes: No enlarged mediastinal, hilar, or  axillary lymph nodes. Thyroid gland, trachea, and esophagus demonstrate no significant findings. Lungs/Pleura: Lungs are clear. No pleural effusion or pneumothorax. Upper Abdomen: No acute abnormality. Musculoskeletal: No c

## 2022-01-31 NOTE — ED Triage Notes (Addendum)
Patient is here for "Cough, increased to SOB" due to COVID19 (on Saturday). Symptoms started on Saturday as well. No fever known. "SOB" worries me. Concerned with Pna. Currently works as Buyer, retail.  ?

## 2022-01-31 NOTE — ED Provider Notes (Signed)
? ? ?Provider Note ? ?Patient Contact: 10:06 AM (approximate) ? ? ?History  ? ?Cough (Covid + on Saturday with increasing shortness of breath - Entered by patient) ? ? ?HPI ? ?Tina Chaney is a 46 y.o. female presents to the urgent care with shortness of breath.  Patient states that she has been symptomatic for approximately 1 week but shortness of breath seems to be worsening.  Patient tested positive for COVID-19 approximately 1 week ago.  She states that she is still having cough that is dry in nature.  She is a daily smoker.  She denies current chest tightness or chest pain. ? ?  ? ? ?Physical Exam  ? ?Triage Vital Signs: ?ED Triage Vitals  ?Enc Vitals Group  ?   BP 01/31/22 0944 125/84  ?   Pulse Rate 01/31/22 0944 100  ?   Resp 01/31/22 0944 20  ?   Temp 01/31/22 0944 98.8 ?F (37.1 ?C)  ?   Temp Source 01/31/22 0944 Oral  ?   SpO2 01/31/22 0944 98 %  ?   Weight 01/31/22 0942 170 lb (77.1 kg)  ?   Height 01/31/22 0942 5\' 8"  (1.727 m)  ?   Head Circumference --   ?   Peak Flow --   ?   Pain Score 01/31/22 0941 0  ?   Pain Loc --   ?   Pain Edu? --   ?   Excl. in GC? --   ? ? ?Most recent vital signs: ?Vitals:  ? 01/31/22 0944  ?BP: 125/84  ?Pulse: 100  ?Resp: 20  ?Temp: 98.8 ?F (37.1 ?C)  ?SpO2: 98%  ? ? ? ?Constitutional: Alert and oriented. Patient is lying supine. ?Eyes: Conjunctivae are normal. PERRL. EOMI. ?Head: Atraumatic. ?ENT: ?     Ears: Tympanic membranes are mildly injected with mild effusion bilaterally.  ?     Nose: No congestion/rhinnorhea. ?     Mouth/Throat: Mucous membranes are moist. Posterior pharynx is mildly erythematous.  ?Hematological/Lymphatic/Immunilogical: No cervical lymphadenopathy.  ?Cardiovascular: Normal rate, regular rhythm. Normal S1 and S2.  Good peripheral circulation. ?Respiratory: Normal respiratory effort without tachypnea or retractions. Lungs CTAB. Good air entry to the bases with no decreased or absent breath sounds. ?Gastrointestinal: Bowel sounds ?4 quadrants.  Soft and nontender to palpation. No guarding or rigidity. No palpable masses. No distention. No CVA tenderness. ?Musculoskeletal: Full range of motion to all extremities. No gross deformities appreciated. ?Neurologic:  Normal speech and language. No gross focal neurologic deficits are appreciated.  ?Skin:  Skin is warm, dry and intact. No rash noted. ?Psychiatric: Mood and affect are normal. Speech and behavior are normal. Patient exhibits appropriate insight and judgement. ? ? ? ?ED Results / Procedures / Treatments  ? ?Labs ?(all labs ordered are listed, but only abnormal results are displayed) ?Labs Reviewed - No data to display ? ? ? ? ? ?RADIOLOGY ? ?{**I personally viewed and evaluated these images as part of my medical decision making, as well as reviewing the written report by the radiologist. ? ?ED Provider Interpretation:  ? ? ?PROCEDURES: ? ?Critical Care performed: No ? ?Procedures ? ? ?MEDICATIONS ORDERED IN ED: ?Medications - No data to display ? ? ?IMPRESSION / MDM / ASSESSMENT AND PLAN / ED COURSE  ?I reviewed the triage vital signs and the nursing notes. ?             ?               ? ?  Assessment and plan: ?SOB ? ?Differential diagnosis includes, but is not limited to, post viral pneumonia, COVID-8, PE ? ?46 year old female presents to the emergency department with shortness of breath after being diagnosed with COVID-19 ? ?Patient was mildly tachycardic at triage but vital signs otherwise reassuring.  She was satting at 98% on room air with no increased work of breathing.  She had good breath sounds in the lung bases without wheezing or other adventitious lung sounds. ? ?I discussed patient's risk factors for PE including COVID-19, daily smoking and recent drive to [place]. ? ?We will obtain 2 view chest x-ray to rule out post viral pneumonia.   ?  ?Chest x-ray shows no consolidations, opacities, infiltrates or evidence of pulmonary edema.  We will treat patient with both cefpodoxime and  azithromycin as well as taper prednisone.  I cautioned patient that if her shortness of breath worsens within 24 hours to seek care at emergency department to rule out PE.  Patient voices understanding and feels comfortable with this plan as she states that she lives very close to both [facility] and [facility].  When she ? ?FINAL CLINICAL IMPRESSION(S) / ED DIAGNOSES  ? ?Final diagnoses:  ?Acute cough  ?SOB (shortness of breath)  ? ? ? ?Rx / DC Orders  ? ?ED Discharge Orders   ? ?      Ordered  ?  cefpodoxime (VANTIN) 200 MG tablet  2 times daily       ? 01/31/22 1025  ?  azithromycin (ZITHROMAX) 250 MG tablet       ? 01/31/22 1025  ?  predniSONE (STERAPRED UNI-PAK 21 TAB) 10 MG (21) TBPK tablet  Status:  Discontinued       ? 01/31/22 1025  ?  predniSONE (STERAPRED UNI-PAK 21 TAB) 10 MG (21) TBPK tablet       ? 01/31/22 1028  ? ?  ?  ? ?  ? ? ? ?Note:  This document was prepared using Dragon voice recognition software and may include unintentional dictation errors. ?  ?Orvil Feil, PA-C ?01/31/22 1031 ? ?

## 2022-01-31 NOTE — Discharge Instructions (Addendum)
Take tapered prednisone as directed. ?Take cefpodoxime twice daily for the next 7 days. ?Take 2 tablets of azithromycin on the first day.  Take 1 tablet on days 2 through 5. ?If your shortness of breath worsens, please seek care at emergency department. ?

## 2022-01-31 NOTE — ED Triage Notes (Signed)
Pt states shob, chest pain and back pain. Pt states is covid positive and does smoke. Pt appears in no acute distress.  ?

## 2022-02-01 ENCOUNTER — Emergency Department: Payer: 59

## 2022-02-01 LAB — TROPONIN I (HIGH SENSITIVITY): Troponin I (High Sensitivity): 4 ng/L (ref <18)

## 2022-02-01 LAB — D-DIMER, QUANTITATIVE: D-Dimer, Quant: 0.34 ug/mL-FEU (ref 0.00–0.50)

## 2022-02-01 MED ORDER — HYDROCODONE-ACETAMINOPHEN 5-325 MG PO TABS
1.0000 | ORAL_TABLET | Freq: Once | ORAL | Status: AC
  Administered 2022-02-01: 1 via ORAL
  Filled 2022-02-01: qty 1

## 2022-02-01 MED ORDER — HYDROCODONE-ACETAMINOPHEN 5-325 MG PO TABS: 1.0000 | ORAL_TABLET | Freq: Four times a day (QID) | ORAL | 0 refills | Status: DC | PRN

## 2022-02-01 MED ORDER — SODIUM CHLORIDE 0.9 % IV BOLUS
1000.0000 mL | Freq: Once | INTRAVENOUS | Status: AC
  Administered 2022-02-01: 1000 mL via INTRAVENOUS

## 2022-02-01 MED ORDER — ALBUTEROL SULFATE HFA 108 (90 BASE) MCG/ACT IN AERS: 2.0000 | INHALATION_SPRAY | RESPIRATORY_TRACT | 0 refills | Status: DC | PRN

## 2022-02-01 MED ORDER — IOHEXOL 350 MG/ML SOLN
75.0000 mL | Freq: Once | INTRAVENOUS | Status: AC | PRN
  Administered 2022-02-01: 75 mL via INTRAVENOUS

## 2022-02-01 MED ORDER — KETOROLAC TROMETHAMINE 30 MG/ML IJ SOLN
10.0000 mg | Freq: Once | INTRAMUSCULAR | Status: AC
  Administered 2022-02-01: 9.9 mg via INTRAVENOUS
  Filled 2022-02-01: qty 1

## 2022-02-01 NOTE — Discharge Instructions (Signed)
1.  Continue and finish medications as prescribed by urgent care. ?2.  You may take Ibuprofen as needed for pain; Norco as needed for more severe pain. ?3.  You may use Albuterol inhaler 2 puffs every 4 hours as needed for cough or difficulty breathing. ?4.  Return to the ER for worsening symptoms, persistent vomiting, difficulty breathing or other concerns. ?

## 2022-11-08 ENCOUNTER — Encounter: Payer: Self-pay | Admitting: Emergency Medicine

## 2022-11-08 ENCOUNTER — Ambulatory Visit
Admission: EM | Admit: 2022-11-08 | Discharge: 2022-11-08 | Disposition: A | Discharge status: Home / Self Care | Payer: No Typology Code available for payment source | Attending: Emergency Medicine | Admitting: Emergency Medicine

## 2022-11-08 ENCOUNTER — Ambulatory Visit: Admit: 2022-11-08 | Discharge status: Against Medical Advice | Payer: No Typology Code available for payment source

## 2022-11-08 DIAGNOSIS — M461 Sacroiliitis, not elsewhere classified: Secondary | ICD-10-CM

## 2022-11-08 LAB — URINALYSIS, W/ REFLEX TO CULTURE (INFECTION SUSPECTED)
Bilirubin Urine: NEGATIVE
Glucose, UA: NEGATIVE mg/dL
Hgb urine dipstick: NEGATIVE
Ketones, ur: NEGATIVE mg/dL
Leukocytes,Ua: NEGATIVE
Nitrite: NEGATIVE
Protein, ur: NEGATIVE mg/dL
Specific Gravity, Urine: 1.02 (ref 1.005–1.030)
pH: 5.5 (ref 5.0–8.0)

## 2022-11-08 MED ORDER — PREDNISONE 10 MG (21) PO TBPK: ORAL_TABLET | ORAL | 0 refills | Status: DC

## 2022-11-08 MED ORDER — DEXAMETHASONE SODIUM PHOSPHATE 10 MG/ML IJ SOLN
10.0000 mg | Freq: Once | INTRAMUSCULAR | Status: AC
  Administered 2022-11-08: 10 mg via INTRAMUSCULAR

## 2022-11-08 NOTE — Discharge Instructions (Signed)
Take the prednisone as directed starting tomorrow morning at breakfast.  Follow the instructions given in your discharge papers for home therapy.  If your pain does not improve I would recommend seeing orthopedics or a spine specialist.  If you develop and loss of bowel or bladder control, or trouble walking I recommend you go to the ER for evaluation.

## 2022-11-08 NOTE — ED Provider Notes (Signed)
[facility]    CSN: [id number] Arrival date & time: 11/08/22  1106      History   Chief Complaint Chief Complaint  Patient presents with   Back Pain    HPI Tina Chaney is a 47 y.o. female.   HPI  47 year old female here for evaluation of left low back pain.  The patient reports that she was starting to experience pain in her left low back 2 days ago that intensified yesterday.  Last night she reported that the pain wraps around to the front of her groin and went on the inside of her thigh to just below her knee.  She states that that comes and goes.  She denies any injury or heavy lifting.  She cannot think of any precipitating event that caused this.  She denies any gait dysfunction or loss of bowel or bladder control.  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  Past Medical History:  Diagnosis Date   Migraines     Patient Active Problem List   Diagnosis Date Noted   Nondisplaced fracture of lateral condyle of right tibia, subsequent encounter for closed fracture with routine healing 02/06/2020   Osteoarthritis of right patellofemoral joint 01/16/2020   Acute pain of right knee 01/12/2020   Patellofemoral dysfunction of right knee 07/14/2017   Acute left-sided low back pain without sciatica 06/16/2016   Tobacco abuse 02/03/2014   Chest pain 02/03/2014   Referral of patient 01/26/2014   SI (sacroiliac) joint dysfunction 04/28/2011   Migraine without aura and without status migrainosus, not intractable 04/28/2011   GERD (gastroesophageal reflux disease) 04/28/2011    Past Surgical History:  Procedure Laterality Date   ENDOMETRIAL ABLATION     TONSILLECTOMY      OB History   No obstetric history on file.      Home Medications    Prior to Admission medications   Medication Sig Start Date End Date Taking? Authorizing Provider  carvedilol (COREG) 3.125 MG tablet Take 1 tablet by mouth 2 (two) times daily with a meal. 09/26/22  Yes  [provider]  predniSONE (STERAPRED UNI-PAK 21 TAB) 10 MG (21) TBPK tablet Take 6 tablets on day 1, 5 tablets day 2, 4 tablets day 3, 3 tablets day 4, 2 tablets day 5, 1 tablet day 6 11/08/22  Yes Becky Augusta, NP  albuterol (VENTOLIN HFA) 108 (90 Base) MCG/ACT inhaler Inhale 2 puffs into the lungs every 4 (four) hours as needed for wheezing or shortness of breath. 02/01/22   Irean Hong, MD  Cetirizine HCl 10 MG CAPS Take by mouth.    [provider]  cyclobenzaprine (FLEXERIL) 10 MG tablet Take 1 tablet (10 mg total) by mouth 2 (two) times daily as needed for muscle spasms. 02/12/21   Delton See, MD  Galcanezumab-gnlm 120 MG/ML SOSY Inject into the skin every 30 (thirty) days.    [provider]  HYDROcodone-acetaminophen (NORCO) 5-325 MG tablet Take 1 tablet by mouth every 6 (six) hours as needed for moderate pain. 02/01/22   Irean Hong, MD  meloxicam (MOBIC) 15 MG tablet Take 1 tablet (15 mg total) by mouth daily. 01/10/20   Verlee Monte, NP  oxyCODONE (OXY IR/ROXICODONE) 5 MG immediate release tablet Take by mouth. 01/16/20   [provider]  promethazine (PHENERGAN) 25 MG tablet Take 1 tablet by mouth every 6 (six) hours as needed. 05/21/19   [provider]  SUMAtriptan (IMITREX) 100 MG tablet Take  100 mg by mouth every 2 (two) hours as needed for migraine. May repeat in 2 hours if headache persists or recurs.    [provider]  SUMAtriptan (IMITREX) 100 MG tablet TAKE 1 TABLET (100 MG TOTAL) BY MOUTH AS DIRECTED FOR MIGRAINE MAY REPEAT AFTER 2 HOURS IF NEEDED. 11/15/19   [provider]  topiramate (TOPAMAX) 100 MG tablet Take 1 tablet by mouth daily. 10/31/20   [provider]  topiramate (TOPAMAX) 50 MG tablet Take 1 tablet by mouth daily. 11/15/19   [provider]    Family History Family History  Problem Relation Age of Onset   Diabetes Father    Renal Disease Father     Social History Social History    Tobacco Use   Smoking status: Former    Types: Cigarettes   Smokeless tobacco: Never  Vaping Use   Vaping Use: Never used  Substance Use Topics   Alcohol use: Never   Drug use: Never     Allergies   Patient has no known allergies.   Review of Systems Review of Systems  Genitourinary:  Negative for difficulty urinating.  Musculoskeletal:  Positive for back pain.  Neurological:  Positive for numbness. Negative for weakness.     Physical Exam Triage Vital Signs ED Triage Vitals  Enc Vitals Group     BP 11/08/22 1155 132/86     Pulse Rate 11/08/22 1155 69     Resp 11/08/22 1155 14     Temp 11/08/22 1155 98 F (36.7 C)     Temp Source 11/08/22 1155 Oral     SpO2 11/08/22 1155 99 %     Weight 11/08/22 1151 170 lb (77.1 kg)     Height 11/08/22 1151 5\' 8"  (1.727 m)     Head Circumference --      Peak Flow --      Pain Score 11/08/22 1151 7     Pain Loc --      Pain Edu? --      Excl. in [place]? --    No data found.  Updated Vital Signs BP 132/86 (BP Location: Left Arm)   Pulse 69   Temp 98 F (36.7 C) (Oral)   Resp 14   Ht 5\' 8"  (1.727 m)   Wt 170 lb (77.1 kg)   SpO2 99%   BMI 25.85 kg/m   Visual Acuity Right Eye Distance:   Left Eye Distance:   Bilateral Distance:    Right Eye Near:   Left Eye Near:    Bilateral Near:     Physical Exam Vitals and nursing note reviewed.  Constitutional:      Appearance: Normal appearance. She is not ill-appearing.  HENT:     Head: Normocephalic and atraumatic.  Cardiovascular:     Rate and Rhythm: Normal rate and regular rhythm.     Pulses: Normal pulses.     Heart sounds: Normal heart sounds. No murmur heard.    No friction rub. No gallop.  Pulmonary:     Effort: Pulmonary effort is normal.     Breath sounds: Normal breath sounds. No wheezing, rhonchi or rales.  Musculoskeletal:        General: Tenderness present. No swelling, deformity or signs of injury. Normal range of motion.  Skin:    General: Skin is  warm and dry.     Capillary Refill: Capillary refill takes less than 2 seconds.  Neurological:     General: No focal deficit present.  Mental Status: She is alert and oriented to person, place, and time.     Sensory: No sensory deficit.     Motor: No weakness.  Psychiatric:        Mood and Affect: Mood normal.        Behavior: Behavior normal.        Thought Content: Thought content normal.        Judgment: Judgment normal.      UC Treatments / Results  Labs (all labs ordered are listed, but only abnormal results are displayed) Labs Reviewed  URINALYSIS, W/ REFLEX TO CULTURE (INFECTION SUSPECTED) - Abnormal; Notable for the following components:      Result Value   Bacteria, UA FEW (*)    All other components within normal limits    EKG   Radiology No results found.  Procedures Procedures (including critical care time)  Medications Ordered in UC Medications  dexamethasone (DECADRON) injection 10 mg (has no administration in time range)    Initial Impression / Assessment and Plan / UC Course  I have reviewed the triage vital signs and the nursing notes.  Pertinent labs & imaging results that were available during my care of the patient were reviewed by me and considered in my medical decision making (see chart for details).   Patient is a very pleasant, nontoxic-appearing 47 year old female here for evaluation of left low back pain that radiates around to her groin and down the inside of her right thigh to the level of her knee.  The back pain started 2 days ago and is still present and the pain in the anterior groin and inner thigh have been off and on since last night.  On exam patient has a benign cardiopulmonary exam with clear lung sounds all fields.  She has no midline spinous process tenderness from her thoracic spine through her sacrum.  There is inflammation and tenderness of the superior SI ligament.  There is no surrounding paraspinous muscle spasm.  No pain  with palpation of the hip joint laterally or anteriorly.  No pain or muscle spasm appreciated with palpation of the quadriceps complex.  Passive range of motion I can achieve 90 degrees of hip flexion without any reproduction of the patient's pain.  Also no reproduction with internal or external rotation.  Patient is moving all extremities equally.  I suspect that she has inflammation of her sacroiliac ligament or exacerbation of her SI joint dysfunction.  I will give her a shot of Decadron in clinic and discharged home with a 6-day prednisone taper to help decrease inflammation as well as give her handout on home physical therapy.  I have advised her that if her pain does not improve following the steroids and physical therapy that she needs to follow-up with orthopedics or spine specialist.  If she develops any loss of bowel or bladder control or difficulty walking she needs to go to the ER for evaluation.  Patient verbalized understanding of same.   Final Clinical Impressions(s) / UC Diagnoses   Final diagnoses:  Sacroiliitis [facility])     Discharge Instructions      Take the prednisone as directed starting tomorrow morning at breakfast.  Follow the instructions given in your discharge papers for home therapy.  If your pain does not improve I would recommend seeing orthopedics or a spine specialist.  If you develop and loss of bowel or bladder control, or trouble walking I recommend you go to the ER for evaluation.  ED Prescriptions     Medication Sig Dispense Auth. Provider   predniSONE (STERAPRED UNI-PAK 21 TAB) 10 MG (21) TBPK tablet Take 6 tablets on day 1, 5 tablets day 2, 4 tablets day 3, 3 tablets day 4, 2 tablets day 5, 1 tablet day 6 21 tablet Margarette Canada, NP      PDMP not reviewed this encounter.   Margarette Canada, NP 11/08/22 1301

## 2022-11-08 NOTE — ED Triage Notes (Signed)
Patient c/o left lower back pain that radiates into left groin and down her left leg.  Patient denies urinary symptoms.  Patient denies any injury or fall.

## 2023-06-11 IMAGING — CR DG CHEST 2V
2 series · 2 of 2 positions shown · non-contrast
Comparison: None.

CLINICAL DATA: Shortness of breath COVID positive.

EXAM:
CHEST - 2 VIEW

[chest pa]
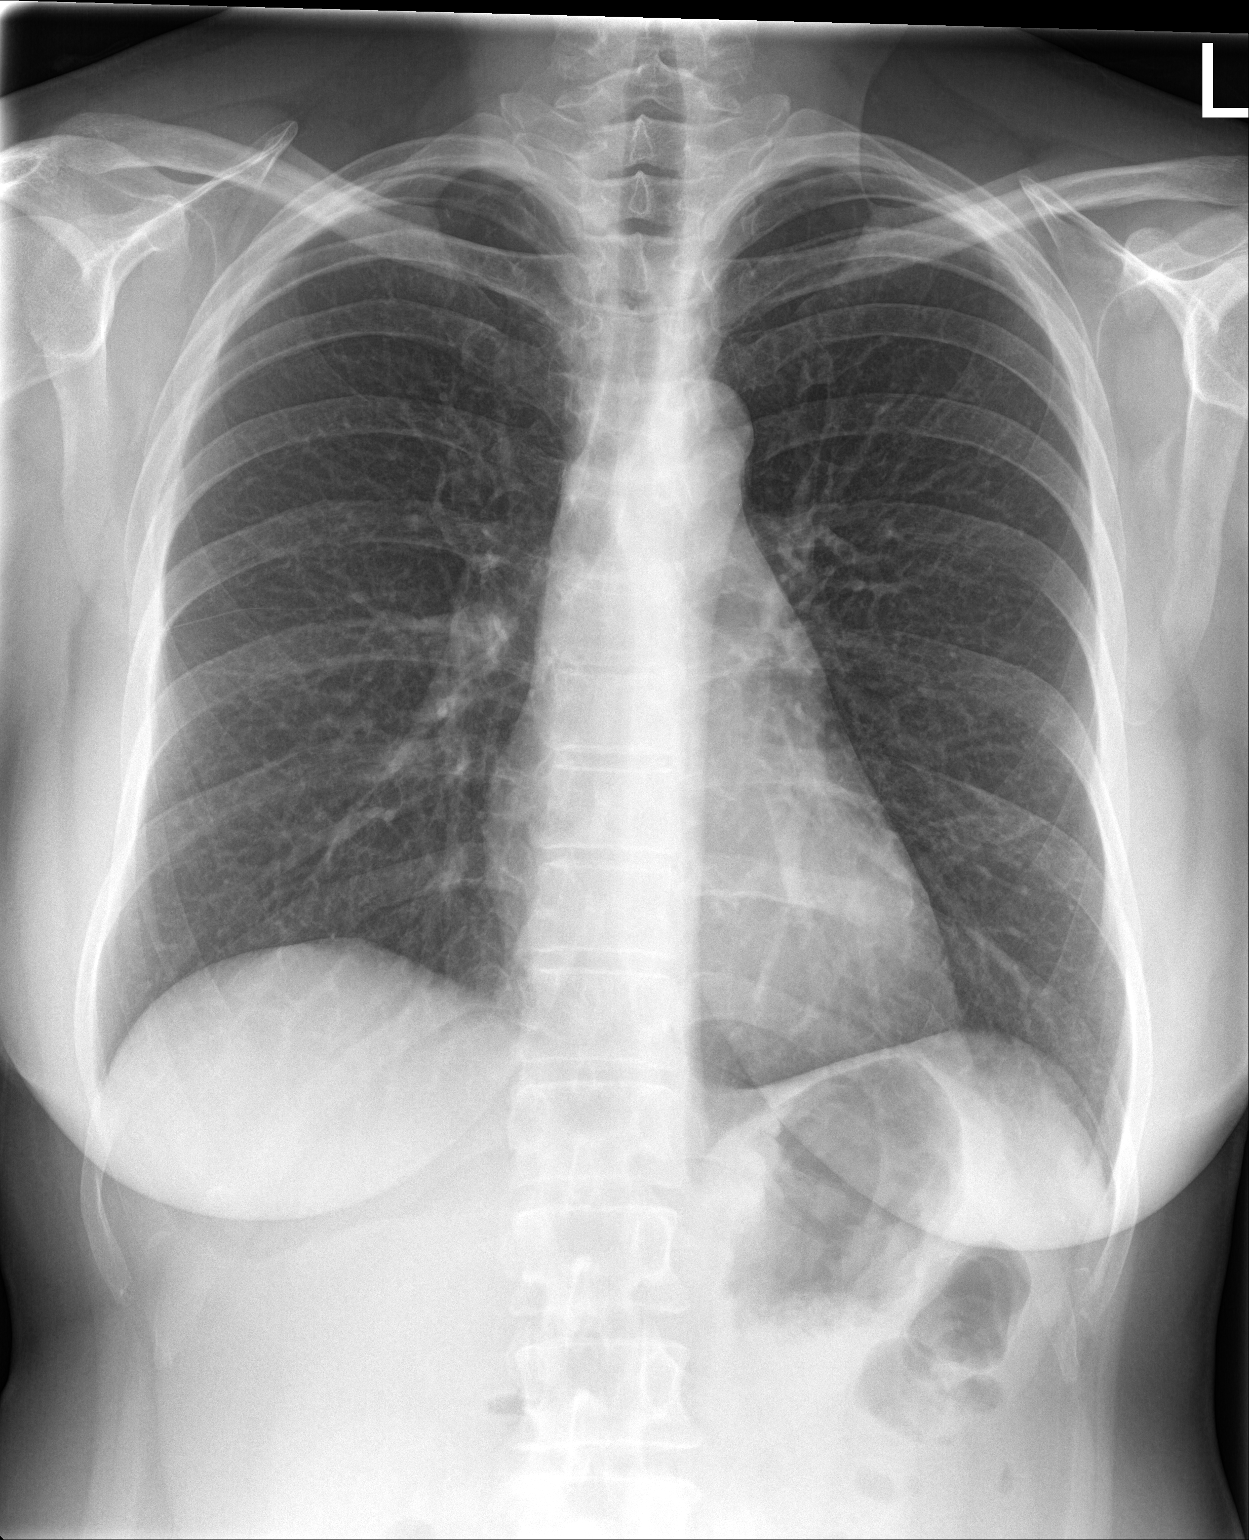

[chest lat]
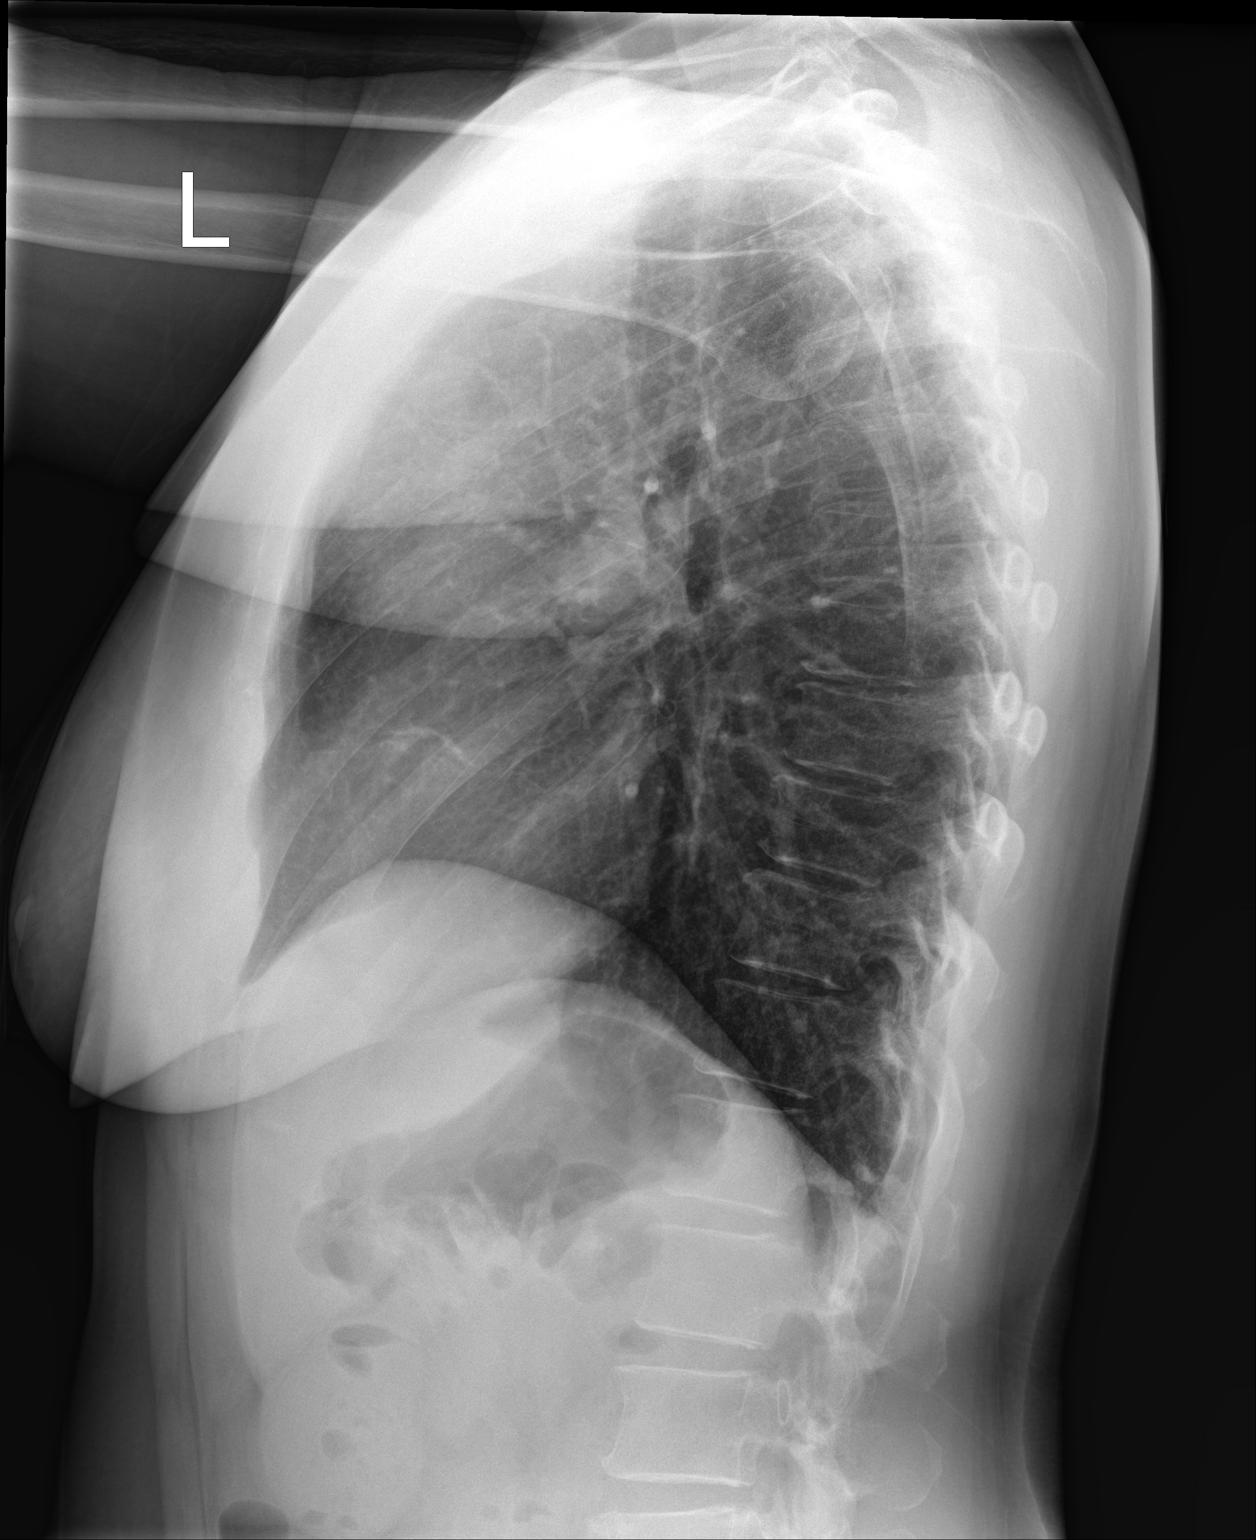

[2 of 2 positions shown; findings below may reference images not displayed]

FINDINGS: The heart size and mediastinal contours are within normal limits.
Both lungs are clear. The visualized skeletal structures are
unremarkable.
IMPRESSION: No acute cardiopulmonary disease.

## 2023-11-02 DIAGNOSIS — S6392XA Sprain of unspecified part of left wrist and hand, initial encounter: Secondary | ICD-10-CM | POA: Diagnosis not present

## 2023-11-17 ENCOUNTER — Ambulatory Visit: Payer: Commercial Managed Care - PPO | Admitting: Family Medicine

## 2023-11-17 ENCOUNTER — Encounter: Payer: Self-pay | Admitting: Family Medicine

## 2023-11-17 ENCOUNTER — Other Ambulatory Visit: Payer: Self-pay

## 2023-11-17 VITALS — BP 124/78 | HR 120 | Ht 68.0 in | Wt 177.0 lb

## 2023-11-17 DIAGNOSIS — R5383 Other fatigue: Secondary | ICD-10-CM

## 2023-11-17 DIAGNOSIS — G43909 Migraine, unspecified, not intractable, without status migrainosus: Secondary | ICD-10-CM

## 2023-11-17 DIAGNOSIS — G901 Familial dysautonomia [Riley-Day]: Secondary | ICD-10-CM | POA: Insufficient documentation

## 2023-11-17 MED ORDER — ONDANSETRON HCL 4 MG PO TABS
4.0000 mg | ORAL_TABLET | Freq: Three times a day (TID) | ORAL | 0 refills | Status: DC | PRN
  Filled 2023-11-17: qty 20, 7d supply, fill #0

## 2023-11-17 MED ORDER — SUMATRIPTAN SUCCINATE 100 MG PO TABS
100.0000 mg | ORAL_TABLET | ORAL | 5 refills | Status: DC | PRN
  Filled 2023-11-17: qty 10, 1d supply, fill #0

## 2023-11-17 MED ORDER — SUMATRIPTAN SUCCINATE 100 MG PO TABS
100.0000 mg | ORAL_TABLET | ORAL | 5 refills | Status: AC | PRN
  Filled 2023-11-17: qty 9, 30d supply, fill #0
  Filled 2023-12-11: qty 9, 30d supply, fill #1

## 2023-11-17 MED ORDER — TOPIRAMATE 100 MG PO TABS
100.0000 mg | ORAL_TABLET | Freq: Every day | ORAL | 6 refills | Status: AC
  Filled 2023-11-17: qty 30, 30d supply, fill #0
  Filled 2023-12-11: qty 30, 30d supply, fill #1
  Filled 2024-01-12: qty 30, 30d supply, fill #2
  Filled 2024-02-04 – 2024-02-05 (×2): qty 30, 30d supply, fill #3
  Filled 2024-03-09: qty 30, 30d supply, fill #4
  Filled 2024-04-11: qty 30, 30d supply, fill #5

## 2023-11-17 NOTE — Addendum Note (Signed)
Addended by: Duanne Limerick on: 11/17/2023 02:44 PM   Modules accepted: Orders

## 2023-11-17 NOTE — Progress Notes (Signed)
 Date:  11/17/2023   Name:  Tina Chaney   DOB:  [birth year]   MRN:  [id number]   Chief Complaint: New Patient (Initial Visit), Fatigue (Patient is always fatigued. Patient said she has trouble falling asleep. 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 ), and Migraine (HX of migraines per chart. Last seen neurology in 2021 at [facility]. Patient worked on [employer] for 4 years and migraines went away. Patient has migraines every few weeks- 8 per month. )  Migraine  This is a chronic problem. The current episode started more than 1 year ago. The problem occurs intermittently. The problem has been gradually worsening. The pain is located in the Temporal (unilateral) region. The quality of the pain is described as throbbing. The pain is at a severity of 7/10. Associated symptoms include insomnia, nausea, phonophobia and photophobia. Pertinent negatives include no numbness, rhinorrhea, scalp tenderness, visual change or weight loss. The symptoms are aggravated by bright light and emotional stress. She has tried acetaminophen  (excedrin) for the symptoms. The treatment provided mild relief. There is no history of hypertension.  Thyroid Problem Presents for follow-up visit. Symptoms include depressed mood, diaphoresis, diarrhea, fatigue, heat intolerance and palpitations. Patient reports no anxiety, cold intolerance, constipation, dry skin, hair loss, leg swelling, menstrual problem, nail problem, tremors, visual change, weight gain or weight loss. The symptoms have been worsening.    Lab Results  Component Value Date   NA 139 01/31/2022   K 4.6 01/31/2022   CO2 25 01/31/2022   GLUCOSE 162 (H) 01/31/2022   BUN 14 01/31/2022   CREATININE 0.58 01/31/2022   CALCIUM 9.4 01/31/2022   GFRNONAA >60 01/31/2022   No results found for: CHOL, HDL, LDLCALC, LDLDIRECT, TRIG, CHOLHDL No results found for: TSH No results found  for: HGBA1C Lab Results  Component Value Date   WBC 9.1 01/31/2022   HGB 14.2 01/31/2022   HCT 44.2 01/31/2022   MCV 87.4 01/31/2022   PLT 357 01/31/2022   No results found for: ALT, AST, GGT, ALKPHOS, BILITOT No results found for: 25OHVITD2, 25OHVITD3, VD25OH   Review of Systems  Constitutional:  Positive for diaphoresis and fatigue. Negative for weight gain and weight loss.  HENT:  Negative for rhinorrhea.   Eyes:  Positive for photophobia.  Cardiovascular:  Positive for palpitations.  Gastrointestinal:  Positive for diarrhea and nausea. Negative for constipation.  Endocrine: Positive for heat intolerance. Negative for cold intolerance.  Genitourinary:  Negative for menstrual problem.  Neurological:  Negative for tremors and numbness.  Psychiatric/Behavioral:  The patient has insomnia. The patient is not nervous/anxious.     Patient Active Problem List   Diagnosis Date Noted   Migraines    Dysautonomia (HCC)    Nondisplaced fracture of lateral condyle of right tibia, subsequent encounter for closed fracture with routine healing 02/06/2020   Osteoarthritis of right patellofemoral joint 01/16/2020   Acute pain of right knee 01/12/2020   Patellofemoral dysfunction of right knee 07/14/2017   Acute left-sided low back pain without sciatica 06/16/2016   Tobacco abuse 02/03/2014   Chest pain 02/03/2014   Referral of patient 01/26/2014   SI (sacroiliac) joint dysfunction 04/28/2011   Migraine without aura and without status migrainosus, not intractable 04/28/2011   GERD (gastroesophageal reflux disease) 04/28/2011    No Known Allergies  Past Surgical History:  Procedure Laterality Date   ENDOMETRIAL ABLATION     TONSILLECTOMY  Social History   Tobacco Use   Smoking status: Some Days    Types: Cigarettes   Smokeless tobacco: Never  Vaping Use   Vaping status: Never Used  Substance Use Topics   Alcohol use: Never   Drug use: Never      Medication list has been reviewed and updated.  Current Meds  Medication Sig   aspirin-acetaminophen -caffeine (EXCEDRIN MIGRAINE) 250-250-65 MG tablet Take by mouth every 6 (six) hours as needed for headache.   Cetirizine HCl 10 MG CAPS Take by mouth.       11/17/2023    2:06 PM  GAD 7 : Generalized Anxiety Score  Nervous, Anxious, on Edge 0  Control/stop worrying 0  Worry too much - different things 0  Trouble relaxing 0  Restless 2  Easily annoyed or irritable 2  Afraid - awful might happen 0  Total GAD 7 Score 4  Anxiety Difficulty Not difficult at all       11/17/2023    2:06 PM  Depression screen PHQ 2/9  Decreased Interest 2  Down, Depressed, Hopeless 0  PHQ - 2 Score 2  Altered sleeping 3  Tired, decreased energy 3  Change in appetite 2  Feeling bad or failure about yourself  0  Trouble concentrating 0  Moving slowly or fidgety/restless 2  Suicidal thoughts 0  PHQ-9 Score 12  Difficult doing work/chores Not difficult at all    BP Readings from Last 3 Encounters:  11/17/23 124/78  11/08/22 132/86  02/01/22 (!) 141/83    Physical Exam Vitals and nursing note reviewed.  Constitutional:      General: She is not in acute distress.    Appearance: She is not diaphoretic.  HENT:     Head: Normocephalic and atraumatic.     Right Ear: External ear normal.     Left Ear: External ear normal.     Nose: Nose normal.  Eyes:     General:        Right eye: No discharge.        Left eye: No discharge.     Conjunctiva/sclera: Conjunctivae normal.     Pupils: Pupils are equal, round, and reactive to light.  Neck:     Thyroid: No thyromegaly.  Cardiovascular:     Rate and Rhythm: Normal rate and regular rhythm.     Heart sounds: Normal heart sounds. No murmur heard.    No friction rub. No gallop.  Pulmonary:     Effort: Pulmonary effort is normal.     Breath sounds: Normal breath sounds. No wheezing, rhonchi or rales.  Abdominal:     General: Bowel  sounds are normal.     Palpations: Abdomen is soft. There is no hepatomegaly or splenomegaly.  Musculoskeletal:     Cervical back: Neck supple.  Lymphadenopathy:     Cervical: No cervical adenopathy.  Skin:    General: Skin is warm and dry.  Neurological:     Mental Status: She is alert.     Wt Readings from Last 3 Encounters:  11/17/23 177 lb (80.3 kg)  11/08/22 170 lb (77.1 kg)  01/31/22 170 lb (77.1 kg)    BP 124/78   Pulse (!) 120   Ht 5' 8 (1.727 m)   Wt 177 lb (80.3 kg)   SpO2 95% Comment: Has dark nail polish on today  BMI 26.91 kg/m   Assessment and Plan: 1. Migraine without status migrainosus, not intractable, unspecified migraine type (Primary) Chronic.  Episodic.  Patient has been having migraines at least 8 a week that of been unresolved with Tylenol  or ibuprofen.  In the past she has been on topiramate  100 mg nightly as well as sumatriptan  100 mg tablet to be used every 2 hours for migraine for breakthrough.  We will refill these for her to resume to see if she can get any control with this in the meantime place neurology consult for evaluation and treatment modality suggestions. - topiramate  (TOPAMAX ) 100 MG tablet; Take 1 tablet (100 mg total) by mouth daily.  Dispense: 30 tablet; Refill: 6 - Ambulatory referral to Neurology - SUMAtriptan  (IMITREX ) 100 MG tablet; Take 1 tablet (100 mg total) by mouth every 2 (two) hours as needed for migraine. May repeat in 2 hours if headache persists or recurs.  Dispense: 10 tablet; Refill: 5  2. Fatigue, unspecified type New onset.  Persistent.  Becoming an issue.  This may be due to insomnia and also noticed that her PHQ is 12.  Depression may be a diagnosis of exclusion to consider with Wellbutrin a consideration of prophylaxis or treatment.  In the meantime we will initiate a fatigue evaluation with sed rate, CMP, TSH, and CBC. - Sedimentation rate - Comprehensive metabolic panel - TSH - CBC with Differential/Platelet     Cathryne Molt, MD

## 2023-11-18 LAB — CBC WITH DIFFERENTIAL/PLATELET
Basophils Absolute: 0.1 10*3/uL (ref 0.0–0.2)
Basos: 1 %
EOS (ABSOLUTE): 0.2 10*3/uL (ref 0.0–0.4)
Eos: 2 %
Hematocrit: 42.1 % (ref 34.0–46.6)
Hemoglobin: 13.7 g/dL (ref 11.1–15.9)
Immature Grans (Abs): 0 10*3/uL (ref 0.0–0.1)
Immature Granulocytes: 0 %
Lymphocytes Absolute: 2.9 10*3/uL (ref 0.7–3.1)
Lymphs: 24 %
MCH: 29.1 pg (ref 26.6–33.0)
MCHC: 32.5 g/dL (ref 31.5–35.7)
MCV: 89 fL (ref 79–97)
Monocytes Absolute: 1.1 10*3/uL — ABNORMAL HIGH (ref 0.1–0.9)
Monocytes: 9 %
Neutrophils Absolute: 7.8 10*3/uL — ABNORMAL HIGH (ref 1.4–7.0)
Neutrophils: 64 %
Platelets: 342 10*3/uL (ref 150–450)
RBC: 4.71 x10E6/uL (ref 3.77–5.28)
RDW: 14 % (ref 11.7–15.4)
WBC: 12.2 10*3/uL — ABNORMAL HIGH (ref 3.4–10.8)

## 2023-11-18 LAB — COMPREHENSIVE METABOLIC PANEL
ALT: 13 [IU]/L (ref 0–32)
AST: 16 [IU]/L (ref 0–40)
Albumin: 4.3 g/dL (ref 3.9–4.9)
Alkaline Phosphatase: 89 [IU]/L (ref 44–121)
BUN/Creatinine Ratio: 15 (ref 9–23)
BUN: 15 mg/dL (ref 6–24)
Bilirubin Total: <0.2 mg/dL (ref 0.0–1.2)
CO2: 26 mmol/L (ref 20–29)
Calcium: 10 mg/dL (ref 8.7–10.2)
Chloride: 101 mmol/L (ref 96–106)
Creatinine, Ser: 1 mg/dL (ref 0.57–1.00)
Globulin, Total: 2.3 g/dL (ref 1.5–4.5)
Glucose: 79 mg/dL (ref 70–99)
Potassium: 3.9 mmol/L (ref 3.5–5.2)
Sodium: 144 mmol/L (ref 134–144)
Total Protein: 6.6 g/dL (ref 6.0–8.5)
eGFR: 69 mL/min/{1.73_m2} (ref >59)

## 2023-11-18 LAB — SEDIMENTATION RATE: Sed Rate: 27 mm/h (ref 0–32)

## 2023-11-18 LAB — TSH: TSH: 1.1 u[IU]/mL (ref 0.450–4.500)

## 2023-11-19 ENCOUNTER — Encounter: Payer: Self-pay | Admitting: Family Medicine

## 2023-11-23 ENCOUNTER — Other Ambulatory Visit: Payer: Self-pay

## 2023-11-23 DIAGNOSIS — H53149 Visual discomfort, unspecified: Secondary | ICD-10-CM | POA: Diagnosis not present

## 2023-11-23 DIAGNOSIS — G479 Sleep disorder, unspecified: Secondary | ICD-10-CM | POA: Diagnosis not present

## 2023-11-23 DIAGNOSIS — G43009 Migraine without aura, not intractable, without status migrainosus: Secondary | ICD-10-CM | POA: Diagnosis not present

## 2023-11-23 MED ORDER — NORTRIPTYLINE HCL 10 MG PO CAPS
10.0000 mg | ORAL_CAPSULE | Freq: Every evening | ORAL | 3 refills | Status: DC
  Filled 2023-12-11 – 2023-12-17 (×3): qty 60, 30d supply, fill #1
  Filled 2024-01-18: qty 60, 30d supply, fill #2
  Filled 2024-02-17: qty 60, 30d supply, fill #3

## 2023-12-01 ENCOUNTER — Encounter: Payer: Self-pay | Admitting: Family Medicine

## 2023-12-01 ENCOUNTER — Ambulatory Visit: Payer: Commercial Managed Care - PPO | Admitting: Family Medicine

## 2023-12-01 VITALS — BP 102/76 | HR 122 | Ht 68.0 in | Wt 177.0 lb

## 2023-12-01 DIAGNOSIS — R002 Palpitations: Secondary | ICD-10-CM

## 2023-12-01 DIAGNOSIS — R0602 Shortness of breath: Secondary | ICD-10-CM

## 2023-12-01 DIAGNOSIS — G43909 Migraine, unspecified, not intractable, without status migrainosus: Secondary | ICD-10-CM

## 2023-12-01 DIAGNOSIS — U099 Post covid-19 condition, unspecified: Secondary | ICD-10-CM

## 2023-12-01 NOTE — Progress Notes (Signed)
Date:  12/01/2023   Name:  Tina Chaney   DOB:  [birth year]   MRN:  [id number]   Chief Complaint: Migraine and Referral ([facility] long covid clinic- for post covid symptoms, high pulse )  Cardiac echo [facility] test/ holter/PFT/chest ct/   Migraine  This is a chronic (history) problem. The current episode started more than 1 year ago. The problem occurs intermittently. The pain is located in the Retro-orbital (alternate) region. The quality of the pain is described as throbbing. Associated symptoms include dizziness, nausea and photophobia. Pertinent negatives include no coughing, numbness, seizures, sore throat or weakness. She has tried acetaminophen and NSAIDs (topamax/recently added norttiptylin) for the symptoms. The treatment provided moderate relief.  Palpitations  This is a chronic problem. The current episode started more than 1 year ago. The problem occurs intermittently. Nothing aggravates the symptoms. Associated symptoms include chest fullness, dizziness, nausea and shortness of breath. Pertinent negatives include no coughing, numbness or weakness. She has tried nothing for the symptoms. covid/January 25 2022  Shortness of Breath This is a chronic problem. The current episode started more than 1 year ago. Associated symptoms include headaches. Pertinent negatives include no leg swelling, sore throat, sputum production or wheezing. Exacerbated by: palpitations. Risk factors: covid. She has tried nothing for the symptoms. The treatment provided mild relief.    Lab Results  Component Value Date   NA 144 11/17/2023   K 3.9 11/17/2023   CO2 26 11/17/2023   GLUCOSE 79 11/17/2023   BUN 15 11/17/2023   CREATININE 1.00 11/17/2023   CALCIUM 10.0 11/17/2023   EGFR 69 11/17/2023   GFRNONAA >60 01/31/2022   No results found for: "CHOL", "HDL", "LDLCALC", "LDLDIRECT", "TRIG", "CHOLHDL" Lab Results  Component Value Date   TSH 1.100 11/17/2023   No results found for: "HGBA1C" Lab  Results  Component Value Date   WBC 12.2 (H) 11/17/2023   HGB 13.7 11/17/2023   HCT 42.1 11/17/2023   MCV 89 11/17/2023   PLT 342 11/17/2023   Lab Results  Component Value Date   ALT 13 11/17/2023   AST 16 11/17/2023   ALKPHOS 89 11/17/2023   BILITOT <0.2 11/17/2023   No results found for: "25OHVITD2", "25OHVITD3", "VD25OH"   Review of Systems  HENT:  Negative for sore throat.   Eyes:  Positive for photophobia.  Respiratory:  Positive for shortness of breath. Negative for cough, sputum production and wheezing.   Cardiovascular:  Positive for palpitations. Negative for leg swelling.  Gastrointestinal:  Positive for nausea.  Neurological:  Positive for dizziness and headaches. Negative for tremors, seizures, syncope, facial asymmetry, speech difficulty, weakness, light-headedness and numbness.    Patient Active Problem List   Diagnosis Date Noted   Migraines    Dysautonomia (HCC)    Nondisplaced fracture of lateral condyle of right tibia, subsequent encounter for closed fracture with routine healing 02/06/2020   Osteoarthritis of right patellofemoral joint 01/16/2020   Acute pain of right knee 01/12/2020   Patellofemoral dysfunction of right knee 07/14/2017   Acute left-sided low back pain without sciatica 06/16/2016   Tobacco abuse 02/03/2014   Chest pain 02/03/2014   Referral of patient 01/26/2014   SI (sacroiliac) joint dysfunction 04/28/2011   Migraine without aura and without status migrainosus, not intractable 04/28/2011   GERD (gastroesophageal reflux disease) 04/28/2011    No Known Allergies  Past Surgical History:  Procedure Laterality Date   ENDOMETRIAL ABLATION     TONSILLECTOMY      Social  History   Tobacco Use   Smoking status: Some Days    Types: Cigarettes   Smokeless tobacco: Never  Vaping Use   Vaping status: Never Used  Substance Use Topics   Alcohol use: Never   Drug use: Never     Medication list has been reviewed and  updated.  Current Meds  Medication Sig   aspirin-acetaminophen-caffeine (EXCEDRIN MIGRAINE) 250-250-65 MG tablet Take by mouth every 6 (six) hours as needed for headache.   Cetirizine HCl 10 MG CAPS Take by mouth.   nortriptyline (PAMELOR) 10 MG capsule Take 1 capsule (10 mg) by mouth at night for one week then increase to 2 capsules (20 mg) by mouth at night (Patient taking differently: Take 20 mg by mouth at bedtime.)   ondansetron (ZOFRAN) 4 MG tablet Take 1 tablet (4 mg total) by mouth every 8 (eight) hours as needed for nausea or vomiting.   SUMAtriptan (IMITREX) 100 MG tablet Take 1 tablet (100 mg total) by mouth every 2 (two) hours as needed for migraine. May repeat in 2 hours if headache persists or recurs.   topiramate (TOPAMAX) 100 MG tablet Take 1 tablet (100 mg total) by mouth daily.       12/01/2023    1:25 PM 11/17/2023    2:06 PM  GAD 7 : Generalized Anxiety Score  Nervous, Anxious, on Edge 1 0  Control/stop worrying 0 0  Worry too much - different things 0 0  Trouble relaxing 1 0  Restless 0 2  Easily annoyed or irritable 1 2  Afraid - awful might happen 0 0  Total GAD 7 Score 3 4  Anxiety Difficulty Not difficult at all Not difficult at all       12/01/2023    1:24 PM 11/17/2023    2:06 PM  Depression screen PHQ 2/9  Decreased Interest 1 2  Down, Depressed, Hopeless 0 0  PHQ - 2 Score 1 2  Altered sleeping 3 3  Tired, decreased energy 2 3  Change in appetite 0 2  Feeling bad or failure about yourself  0 0  Trouble concentrating 0 0  Moving slowly or fidgety/restless 0 2  Suicidal thoughts 0 0  PHQ-9 Score 6 12  Difficult doing work/chores Not difficult at all Not difficult at all    BP Readings from Last 3 Encounters:  12/01/23 102/76  11/17/23 124/78  11/08/22 132/86    Physical Exam Vitals and nursing note reviewed.  Constitutional:      General: She is not in acute distress.    Appearance: She is not diaphoretic.  HENT:     Head: Normocephalic  and atraumatic.     Right Ear: External ear normal.     Left Ear: External ear normal.     Nose: Nose normal. No congestion or rhinorrhea.     Mouth/Throat:     Mouth: Mucous membranes are moist.  Eyes:     General:        Right eye: No discharge.        Left eye: No discharge.     Conjunctiva/sclera: Conjunctivae normal.     Pupils: Pupils are equal, round, and reactive to light.  Neck:     Thyroid: No thyromegaly.     Vascular: No JVD.  Cardiovascular:     Rate and Rhythm: Normal rate and regular rhythm.     Heart sounds: Normal heart sounds, S1 normal and S2 normal. No murmur heard.    No systolic  murmur is present.     No diastolic murmur is present.     No friction rub. No gallop. No S3 or S4 sounds.  Pulmonary:     Effort: Pulmonary effort is normal.     Breath sounds: Normal breath sounds. No wheezing or rhonchi.  Abdominal:     General: Bowel sounds are normal.     Palpations: Abdomen is soft. There is no mass.     Tenderness: There is no abdominal tenderness. There is no guarding.  Musculoskeletal:        General: Normal range of motion.     Cervical back: Normal range of motion and neck supple.  Lymphadenopathy:     Cervical: No cervical adenopathy.  Skin:    General: Skin is warm and dry.  Neurological:     Mental Status: She is alert.     Deep Tendon Reflexes: Reflexes are normal and symmetric.     Wt Readings from Last 3 Encounters:  12/01/23 177 lb (80.3 kg)  11/17/23 177 lb (80.3 kg)  11/08/22 170 lb (77.1 kg)    BP 102/76   Pulse (!) 122   Ht 5\' 8"  (1.727 m)   Wt 177 lb (80.3 kg)   SpO2 97%   BMI 26.91 kg/m   Assessment and Plan:  1. Migraine without status migrainosus, not intractable, unspecified migraine type (Primary) Chronic.  Episodic.  Stable.  Currently was treated with tamoxifen 100 mg once a day and Zofran as well as acetaminophen with or without NSAID.  Recent visit with neurology was made and they added nortriptyline to the  patient's regimen for prophylaxis management.  Patient is tolerating all at this time and seems to be better.  2. Post-COVID chronic palpitations Patient has had COVID in 2023 and since then has had episodes tachycardia associated with shortness of breath.  This has been fully evaluated by cardiology and pulmonary and patient had a referral to the post COVID clinic at [facility] but apparently this fell through the cracks and she never got a call.  I am not sure if this is may be a COVID-19 long-haul or manifesting chronic palpitations or post-COVID syndrome that is manifesting chronic palpitations but we will attempt to make this referral to see if this can be confirmed as well as any therapeutic means that can be initiated. - Ambulatory referral to Physical Medicine Rehab    3. Shortness of breath Chronic.  Occurring with palpitations which occurred about 2 weeks after COVID in 2023.  Patient has episodic on a weekly basis of palpitations which shortness of breath probably secondary to the tachycardia.  Patient has had cardiac and pulmonary evaluations but still would like further eval and perhaps therapeutic modalities initiated by the post-COVID clinic at [facility].   Elizabeth Sauer, MD

## 2023-12-11 ENCOUNTER — Other Ambulatory Visit: Payer: Self-pay | Admitting: Family Medicine

## 2023-12-11 ENCOUNTER — Other Ambulatory Visit: Payer: Self-pay

## 2023-12-11 DIAGNOSIS — G43909 Migraine, unspecified, not intractable, without status migrainosus: Secondary | ICD-10-CM

## 2023-12-11 MED FILL — Ondansetron HCl Tab 4 MG: ORAL | 7 days supply | Qty: 20 | Fill #0 | Status: CN

## 2023-12-11 MED FILL — Ondansetron HCl Tab 4 MG: ORAL | 7 days supply | Qty: 20 | Fill #0 | Status: AC

## 2023-12-14 ENCOUNTER — Other Ambulatory Visit: Payer: Self-pay

## 2023-12-16 ENCOUNTER — Other Ambulatory Visit: Payer: Self-pay

## 2023-12-29 ENCOUNTER — Other Ambulatory Visit: Payer: Self-pay

## 2023-12-29 DIAGNOSIS — R519 Headache, unspecified: Secondary | ICD-10-CM | POA: Diagnosis not present

## 2023-12-29 DIAGNOSIS — G8929 Other chronic pain: Secondary | ICD-10-CM | POA: Diagnosis not present

## 2023-12-29 DIAGNOSIS — G4701 Insomnia due to medical condition: Secondary | ICD-10-CM | POA: Diagnosis not present

## 2023-12-29 DIAGNOSIS — G9332 Myalgic encephalomyelitis/chronic fatigue syndrome: Secondary | ICD-10-CM | POA: Diagnosis not present

## 2023-12-29 DIAGNOSIS — R2990 Unspecified symptoms and signs involving the nervous system: Secondary | ICD-10-CM | POA: Diagnosis not present

## 2023-12-29 DIAGNOSIS — G901 Familial dysautonomia [Riley-Day]: Secondary | ICD-10-CM | POA: Diagnosis not present

## 2023-12-29 DIAGNOSIS — R0609 Other forms of dyspnea: Secondary | ICD-10-CM | POA: Diagnosis not present

## 2023-12-29 DIAGNOSIS — R002 Palpitations: Secondary | ICD-10-CM | POA: Diagnosis not present

## 2023-12-29 DIAGNOSIS — U099 Post covid-19 condition, unspecified: Secondary | ICD-10-CM | POA: Diagnosis not present

## 2023-12-29 DIAGNOSIS — Z7409 Other reduced mobility: Secondary | ICD-10-CM | POA: Diagnosis not present

## 2023-12-29 MED ORDER — TRAZODONE HCL 50 MG PO TABS
50.0000 mg | ORAL_TABLET | Freq: Every evening | ORAL | 1 refills | Status: DC
  Filled 2023-12-29: qty 60, 30d supply, fill #0
  Filled 2024-01-26: qty 60, 30d supply, fill #1

## 2023-12-30 ENCOUNTER — Other Ambulatory Visit: Payer: Self-pay

## 2024-01-26 ENCOUNTER — Other Ambulatory Visit: Payer: Self-pay

## 2024-01-26 DIAGNOSIS — G90A Postural orthostatic tachycardia syndrome (POTS): Secondary | ICD-10-CM | POA: Diagnosis not present

## 2024-01-26 DIAGNOSIS — U099 Post covid-19 condition, unspecified: Secondary | ICD-10-CM | POA: Diagnosis not present

## 2024-01-26 MED ORDER — PROPRANOLOL HCL 10 MG PO TABS
10.0000 mg | ORAL_TABLET | Freq: Two times a day (BID) | ORAL | 2 refills | Status: AC
  Filled 2024-01-26: qty 60, 20d supply, fill #0
  Filled 2024-02-10 – 2024-02-11 (×2): qty 60, 20d supply, fill #1
  Filled 2024-03-02: qty 60, 20d supply, fill #2

## 2024-02-04 ENCOUNTER — Other Ambulatory Visit: Payer: Self-pay

## 2024-02-04 DIAGNOSIS — G479 Sleep disorder, unspecified: Secondary | ICD-10-CM | POA: Diagnosis not present

## 2024-02-04 DIAGNOSIS — H53149 Visual discomfort, unspecified: Secondary | ICD-10-CM | POA: Diagnosis not present

## 2024-02-04 DIAGNOSIS — G43009 Migraine without aura, not intractable, without status migrainosus: Secondary | ICD-10-CM | POA: Diagnosis not present

## 2024-02-04 MED ORDER — UBRELVY 100 MG PO TABS
50.0000 mg | ORAL_TABLET | ORAL | 5 refills | Status: AC | PRN
  Filled 2024-02-04 – 2024-02-10 (×3): qty 16, 30d supply, fill #0
  Filled 2024-03-02 – 2024-03-08 (×2): qty 16, 30d supply, fill #1
  Filled 2024-04-17: qty 16, 30d supply, fill #2
  Filled 2024-06-24: qty 16, 30d supply, fill #3

## 2024-02-05 ENCOUNTER — Other Ambulatory Visit: Payer: Self-pay

## 2024-02-10 ENCOUNTER — Other Ambulatory Visit: Payer: Self-pay

## 2024-02-11 ENCOUNTER — Other Ambulatory Visit: Payer: Self-pay

## 2024-02-12 ENCOUNTER — Telehealth: Admitting: Physician Assistant

## 2024-02-12 DIAGNOSIS — L259 Unspecified contact dermatitis, unspecified cause: Secondary | ICD-10-CM | POA: Diagnosis not present

## 2024-02-12 MED ORDER — TRIAMCINOLONE ACETONIDE 0.1 % EX CREA: 1.0000 | TOPICAL_CREAM | Freq: Two times a day (BID) | CUTANEOUS | 0 refills | Status: AC

## 2024-02-12 NOTE — Patient Instructions (Addendum)
 Tina Chaney, thank you for joining Tina Settle, PA-C for today's virtual visit.  While this provider is not your primary care provider (PCP), if your PCP is located in our provider database this encounter information will be shared with them immediately following your visit.   A [facility] MyChart account gives you access to today's visit and all your visits, tests, and labs performed at [facility] " click here if you don't have a [facility] MyChart account or go to [email or web address]  Consent: (Patient) Tina Chaney provided verbal consent for this virtual visit at the beginning of the encounter.  Current Medications:  Current Outpatient Medications:    aspirin-acetaminophen -caffeine (EXCEDRIN MIGRAINE) 250-250-65 MG tablet, Take by mouth every 6 (six) hours as needed for headache., Disp: , Rfl:    Cetirizine HCl 10 MG CAPS, Take by mouth., Disp: , Rfl:    nortriptyline  (PAMELOR ) 10 MG capsule, Take 1 capsule (10 mg) by mouth at night for one week then increase to 2 capsules (20 mg) by mouth at night (Patient taking differently: Take 20 mg by mouth at bedtime.), Disp: 60 capsule, Rfl: 3   ondansetron  (ZOFRAN ) 4 MG tablet, Take 1 tablet (4 mg total) by mouth every 8 (eight) hours as needed for nausea or vomiting., Disp: 20 tablet, Rfl: 0   propranolol  (INDERAL ) 10 MG tablet, Take 1 tablet (10 mg total) by mouth 2 (two) times daily. Increase to max of 2 tablets (20 mg total) twice daily if needed. Keep HR >50 bpm., Disp: 60 tablet, Rfl: 2   SUMAtriptan  (IMITREX ) 100 MG tablet, Take 1 tablet (100 mg total) by mouth every 2 (two) hours as needed for migraine. May repeat in 2 hours if headache persists or recurs., Disp: 10 tablet, Rfl: 5   topiramate  (TOPAMAX ) 100 MG tablet, Take 1 tablet (100 mg total) by mouth daily., Disp: 30 tablet, Rfl: 6   traZODone  (DESYREL ) 50 MG tablet, Take 1 tablet (50 mg total) by mouth Nightly. If ineffective, try 2 tablets (100 mg  total) nightly., Disp: 60 tablet, Rfl: 1   Ubrogepant  (UBRELVY ) 100 MG TABS, Take 50-100mg  (1/2-1 tablet) at headache onset. Can repeat after 2 hours if needed. Do not exceed 200mg  (2 tablets) in 24 hours., Disp: 16 tablet, Rfl: 5   Medications ordered in this encounter:  No orders of the defined types were placed in this encounter.    *If you need refills on other medications prior to your next appointment, please contact your pharmacy*  Follow-Up: Call back or seek an in-person evaluation if the symptoms worsen or if the condition fails to improve as anticipated.  [facility] [phone]  Other Instructions Please report to the nearest Emergency room with any worsening symptoms. Follow up with primary care provider (PCP) in 2 -3 days.    If you have been instructed to have an in-person evaluation today at a local Urgent Care facility, please use the link below. It will take you to a list of all of our available [facility], including address, phone number and hours of operation. Please do not delay care.  [facility]  If you or a family member do not have a primary care provider, use the link below to schedule a visit and establish care. When you choose a [facility] primary care physician or advanced practice provider, you gain a long-term partner in health. Find a Primary Care Provider  Learn more about 's in-office and  virtual care options: [facility] - Get Care Now

## 2024-02-12 NOTE — Progress Notes (Signed)
 Virtual Visit Consent   Tina Chaney, you are scheduled for a virtual visit with a [facility] provider today. Just as with appointments in the office, your consent must be obtained to participate. Your consent will be active for this visit and any virtual visit you may have with one of our providers in the next 365 days. If you have a MyChart account, a copy of this consent can be sent to you electronically.  As this is a virtual visit, video technology does not allow for your provider to perform a traditional examination. This may limit your provider's ability to fully assess your condition. If your provider identifies any concerns that need to be evaluated in person or the need to arrange testing (such as labs, EKG, etc.), we will make arrangements to do so. Although advances in technology are sophisticated, we cannot ensure that it will always work on either your end or our end. If the connection with a video visit is poor, the visit may have to be switched to a telephone visit. With either a video or telephone visit, we are not always able to ensure that we have a secure connection.  By engaging in this virtual visit, you consent to the provision of healthcare and authorize for your insurance to be billed (if applicable) for the services provided during this visit. Depending on your insurance coverage, you may receive a charge related to this service.  I need to obtain your verbal consent now. Are you willing to proceed with your visit today? LEEA Chaney has provided verbal consent on 02/12/2024 for a virtual visit (video or telephone). [name], [place]  Date: 02/12/2024 5:57 PM   Virtual Visit via Video Note   I, [name], connected with  Tina Chaney  ([id number], February 04, 1976) on 02/12/24 at  5:45 PM EDT by a video-enabled telemedicine application and verified that I am speaking with the correct person using two identifiers.  Location: Patient: Virtual Visit Location  Patient: Home Provider: Virtual Visit Location Provider: Home Office   I discussed the limitations of evaluation and management by telemedicine and the availability of in person appointments. The patient expressed understanding and agreed to proceed.    History of Present Illness: Tina Chaney is a 48 y.o. who identifies as a female who was assigned female at birth, and is being seen today for rash.  HPI: Rash This is a new problem. The current episode started yesterday. The problem is unchanged. The affected locations include the right upper leg and left lower leg. The rash is characterized by redness and itchiness. She was exposed to nothing. Pertinent negatives include no anorexia, congestion, cough, diarrhea, eye pain, facial edema, fatigue, fever, joint pain, nail changes, rhinorrhea, shortness of breath, sore throat or vomiting. Past treatments include nothing. The treatment provided no relief.    Problems:  Patient Active Problem List   Diagnosis Date Noted   Migraines    Dysautonomia (HCC)    Nondisplaced fracture of lateral condyle of right tibia, subsequent encounter for closed fracture with routine healing 02/06/2020   Osteoarthritis of right patellofemoral joint 01/16/2020   Acute pain of right knee 01/12/2020   Patellofemoral dysfunction of right knee 07/14/2017   Acute left-sided low back pain without sciatica 06/16/2016   Tobacco abuse 02/03/2014   Chest pain 02/03/2014   Referral of patient 01/26/2014   SI (sacroiliac) joint dysfunction 04/28/2011   Migraine without aura and without status migrainosus, not intractable 04/28/2011   GERD (gastroesophageal  reflux disease) 04/28/2011    Allergies: No Known Allergies Medications:  Current Outpatient Medications:    aspirin-acetaminophen -caffeine (EXCEDRIN MIGRAINE) 250-250-65 MG tablet, Take by mouth every 6 (six) hours as needed for headache., Disp: , Rfl:    Cetirizine HCl 10 MG CAPS, Take by mouth., Disp: , Rfl:     nortriptyline  (PAMELOR ) 10 MG capsule, Take 1 capsule (10 mg) by mouth at night for one week then increase to 2 capsules (20 mg) by mouth at night (Patient taking differently: Take 20 mg by mouth at bedtime.), Disp: 60 capsule, Rfl: 3   ondansetron  (ZOFRAN ) 4 MG tablet, Take 1 tablet (4 mg total) by mouth every 8 (eight) hours as needed for nausea or vomiting., Disp: 20 tablet, Rfl: 0   propranolol  (INDERAL ) 10 MG tablet, Take 1 tablet (10 mg total) by mouth 2 (two) times daily. Increase to max of 2 tablets (20 mg total) twice daily if needed. Keep HR >50 bpm., Disp: 60 tablet, Rfl: 2   SUMAtriptan  (IMITREX ) 100 MG tablet, Take 1 tablet (100 mg total) by mouth every 2 (two) hours as needed for migraine. May repeat in 2 hours if headache persists or recurs., Disp: 10 tablet, Rfl: 5   topiramate  (TOPAMAX ) 100 MG tablet, Take 1 tablet (100 mg total) by mouth daily., Disp: 30 tablet, Rfl: 6   traZODone  (DESYREL ) 50 MG tablet, Take 1 tablet (50 mg total) by mouth Nightly. If ineffective, try 2 tablets (100 mg total) nightly., Disp: 60 tablet, Rfl: 1   Ubrogepant  (UBRELVY ) 100 MG TABS, Take 50-100mg  (1/2-1 tablet) at headache onset. Can repeat after 2 hours if needed. Do not exceed 200mg  (2 tablets) in 24 hours., Disp: 16 tablet, Rfl: 5  Observations/Objective: Patient is well-developed, well-nourished in no acute distress.  Resting comfortably  at home.  Head is normocephalic, atraumatic.  No labored breathing.  Speech is clear and coherent with logical content.  Patient is alert and oriented at baseline.  2 lesions one right upper leg superior to knee the other mid calf lateral right leg. Both red with well defined borders however scaly throughout based on my visualization   Assessment and Plan: 1. Contact dermatitis, unspecified contact dermatitis type, unspecified trigger (Primary)  Patient presenting with contact dermatitis.   Patient afebrile.  No evidence of infections including cellulitis,  herpes zoster, measles, rubella. Patient prescribed steroid cream.  Advised to monitor exposures and record.  Supportive therapies discussed.   Follow up with primary provider in 3-5 days if symptoms continue. Report to ED if high fever, spread of rash, pain, or other concerns.  Follow Up Instructions: I discussed the assessment and treatment plan with the patient. The patient was provided an opportunity to ask questions and all were answered. The patient agreed with the plan and demonstrated an understanding of the instructions.  A copy of instructions were sent to the patient via MyChart unless otherwise noted below.    The patient was advised to call back or seek an in-person evaluation if the symptoms worsen or if the condition fails to improve as anticipated.    [name], PA-C

## 2024-03-02 ENCOUNTER — Other Ambulatory Visit: Payer: Self-pay

## 2024-03-02 DIAGNOSIS — U099 Post covid-19 condition, unspecified: Secondary | ICD-10-CM | POA: Diagnosis not present

## 2024-03-02 DIAGNOSIS — I4711 Inappropriate sinus tachycardia, so stated: Secondary | ICD-10-CM | POA: Diagnosis not present

## 2024-03-02 DIAGNOSIS — H5213 Myopia, bilateral: Secondary | ICD-10-CM | POA: Diagnosis not present

## 2024-03-02 DIAGNOSIS — H52223 Regular astigmatism, bilateral: Secondary | ICD-10-CM | POA: Diagnosis not present

## 2024-03-02 DIAGNOSIS — G4701 Insomnia due to medical condition: Secondary | ICD-10-CM | POA: Diagnosis not present

## 2024-03-02 DIAGNOSIS — H524 Presbyopia: Secondary | ICD-10-CM | POA: Diagnosis not present

## 2024-03-02 MED ORDER — TRAZODONE HCL 50 MG PO TABS
50.0000 mg | ORAL_TABLET | Freq: Every evening | ORAL | 0 refills | Status: AC
  Filled 2024-03-02: qty 180, 90d supply, fill #0

## 2024-03-02 MED ORDER — IVABRADINE HCL 5 MG PO TABS
5.0000 mg | ORAL_TABLET | Freq: Two times a day (BID) | ORAL | 2 refills | Status: AC
  Filled 2024-03-02: qty 60, 30d supply, fill #0
  Filled 2024-03-29: qty 60, 30d supply, fill #1

## 2024-03-03 ENCOUNTER — Other Ambulatory Visit: Payer: Self-pay

## 2024-03-08 ENCOUNTER — Other Ambulatory Visit: Payer: Self-pay

## 2024-03-17 ENCOUNTER — Other Ambulatory Visit: Payer: Self-pay

## 2024-03-17 MED ORDER — NORTRIPTYLINE HCL 10 MG PO CAPS
10.0000 mg | ORAL_CAPSULE | Freq: Every evening | ORAL | 3 refills | Status: DC
  Filled 2024-03-17: qty 60, 30d supply, fill #0
  Filled 2024-04-17: qty 60, 30d supply, fill #1
  Filled 2024-05-22: qty 60, 30d supply, fill #2
  Filled 2024-06-24: qty 60, 30d supply, fill #3

## 2024-03-30 ENCOUNTER — Other Ambulatory Visit: Payer: Self-pay

## 2024-03-30 MED ORDER — IVABRADINE HCL 7.5 MG PO TABS
7.5000 mg | ORAL_TABLET | Freq: Two times a day (BID) | ORAL | 2 refills | Status: AC
  Filled 2024-03-30: qty 60, 30d supply, fill #0
  Filled 2024-04-25: qty 46, 23d supply, fill #1
  Filled 2024-04-25: qty 14, 7d supply, fill #1
  Filled 2024-11-14: qty 60, 30d supply, fill #2

## 2024-03-31 ENCOUNTER — Other Ambulatory Visit: Payer: Self-pay

## 2024-04-25 ENCOUNTER — Other Ambulatory Visit: Payer: Self-pay

## 2024-05-05 ENCOUNTER — Other Ambulatory Visit: Payer: Self-pay

## 2024-05-05 DIAGNOSIS — G43719 Chronic migraine without aura, intractable, without status migrainosus: Secondary | ICD-10-CM | POA: Diagnosis not present

## 2024-05-05 DIAGNOSIS — G479 Sleep disorder, unspecified: Secondary | ICD-10-CM | POA: Diagnosis not present

## 2024-05-05 DIAGNOSIS — H93A3 Pulsatile tinnitus, bilateral: Secondary | ICD-10-CM | POA: Diagnosis not present

## 2024-05-05 DIAGNOSIS — G90A Postural orthostatic tachycardia syndrome (POTS): Secondary | ICD-10-CM | POA: Diagnosis not present

## 2024-05-05 DIAGNOSIS — H53149 Visual discomfort, unspecified: Secondary | ICD-10-CM | POA: Diagnosis not present

## 2024-05-05 MED ORDER — TOPIRAMATE 50 MG PO TABS
150.0000 mg | ORAL_TABLET | Freq: Every day | ORAL | 1 refills | Status: AC
  Filled 2024-05-05: qty 270, 90d supply, fill #0
  Filled 2024-08-10: qty 270, 90d supply, fill #1

## 2024-05-05 MED ORDER — ONDANSETRON HCL 4 MG PO TABS
4.0000 mg | ORAL_TABLET | Freq: Three times a day (TID) | ORAL | 0 refills | Status: AC | PRN
  Filled 2024-05-05: qty 20, 7d supply, fill #0

## 2024-05-05 MED ORDER — IVABRADINE HCL 7.5 MG PO TABS
7.5000 mg | ORAL_TABLET | Freq: Two times a day (BID) | ORAL | 1 refills | Status: AC
  Filled 2024-05-05 – 2024-05-22 (×3): qty 180, 90d supply, fill #0
  Filled 2024-08-22: qty 180, 90d supply, fill #1

## 2024-05-06 ENCOUNTER — Other Ambulatory Visit: Payer: Self-pay | Admitting: Physician Assistant

## 2024-05-06 DIAGNOSIS — H93A3 Pulsatile tinnitus, bilateral: Secondary | ICD-10-CM

## 2024-05-06 DIAGNOSIS — G43719 Chronic migraine without aura, intractable, without status migrainosus: Secondary | ICD-10-CM

## 2024-05-06 DIAGNOSIS — H53149 Visual discomfort, unspecified: Secondary | ICD-10-CM

## 2024-05-19 ENCOUNTER — Encounter: Payer: Self-pay | Admitting: Physician Assistant

## 2024-05-22 ENCOUNTER — Other Ambulatory Visit: Payer: Self-pay

## 2024-05-23 ENCOUNTER — Other Ambulatory Visit: Payer: Self-pay

## 2024-05-24 ENCOUNTER — Other Ambulatory Visit: Payer: Self-pay

## 2024-05-24 ENCOUNTER — Ambulatory Visit
Admission: RE | Admit: 2024-05-24 | Discharge: 2024-05-24 | Disposition: A | Discharge status: Home / Self Care | Source: Ambulatory Visit | Attending: Physician Assistant | Admitting: Physician Assistant

## 2024-05-24 DIAGNOSIS — H93A3 Pulsatile tinnitus, bilateral: Secondary | ICD-10-CM

## 2024-05-24 DIAGNOSIS — G43909 Migraine, unspecified, not intractable, without status migrainosus: Secondary | ICD-10-CM | POA: Diagnosis not present

## 2024-05-24 DIAGNOSIS — G43719 Chronic migraine without aura, intractable, without status migrainosus: Secondary | ICD-10-CM

## 2024-05-24 DIAGNOSIS — H53149 Visual discomfort, unspecified: Secondary | ICD-10-CM

## 2024-05-24 MED ORDER — GADOPICLENOL 0.5 MMOL/ML IV SOLN
10.0000 mL | Freq: Once | INTRAVENOUS | Status: AC | PRN
  Administered 2024-05-24 (×2): 8 mL via INTRAVENOUS

## 2024-06-09 DIAGNOSIS — R69 Illness, unspecified: Secondary | ICD-10-CM | POA: Diagnosis not present

## 2024-06-09 DIAGNOSIS — G43719 Chronic migraine without aura, intractable, without status migrainosus: Secondary | ICD-10-CM | POA: Diagnosis not present

## 2024-06-10 DIAGNOSIS — G43719 Chronic migraine without aura, intractable, without status migrainosus: Secondary | ICD-10-CM | POA: Diagnosis not present

## 2024-07-19 DIAGNOSIS — G43719 Chronic migraine without aura, intractable, without status migrainosus: Secondary | ICD-10-CM | POA: Diagnosis not present

## 2024-07-19 DIAGNOSIS — R69 Illness, unspecified: Secondary | ICD-10-CM | POA: Diagnosis not present

## 2024-07-25 ENCOUNTER — Other Ambulatory Visit: Payer: Self-pay

## 2024-07-26 ENCOUNTER — Other Ambulatory Visit: Payer: Self-pay

## 2024-07-27 ENCOUNTER — Other Ambulatory Visit: Payer: Self-pay

## 2024-07-28 ENCOUNTER — Other Ambulatory Visit: Payer: Self-pay

## 2024-07-28 MED ORDER — NORTRIPTYLINE HCL 10 MG PO CAPS
10.0000 mg | ORAL_CAPSULE | Freq: Every evening | ORAL | 3 refills | Status: AC
  Filled 2024-07-28: qty 60, 30d supply, fill #0
  Filled 2024-08-22: qty 60, 30d supply, fill #1

## 2024-08-10 ENCOUNTER — Other Ambulatory Visit: Payer: Self-pay

## 2024-08-24 DIAGNOSIS — G43719 Chronic migraine without aura, intractable, without status migrainosus: Secondary | ICD-10-CM | POA: Diagnosis not present

## 2024-08-24 DIAGNOSIS — R69 Illness, unspecified: Secondary | ICD-10-CM | POA: Diagnosis not present

## 2024-09-15 DIAGNOSIS — G43719 Chronic migraine without aura, intractable, without status migrainosus: Secondary | ICD-10-CM | POA: Diagnosis not present

## 2024-09-23 ENCOUNTER — Other Ambulatory Visit: Payer: Self-pay

## 2024-09-23 MED ORDER — UBRELVY 100 MG PO TABS
100.0000 mg | ORAL_TABLET | ORAL | 5 refills | Status: AC
  Filled 2024-09-23 – 2024-11-14 (×4): qty 16, 30d supply, fill #0

## 2024-09-23 MED ORDER — NORTRIPTYLINE HCL 10 MG PO CAPS
ORAL_CAPSULE | ORAL | 3 refills | Status: AC
  Filled 2024-09-23: qty 60, 33d supply, fill #0
  Filled 2024-10-27: qty 60, 30d supply, fill #1

## 2024-09-26 ENCOUNTER — Other Ambulatory Visit: Payer: Self-pay

## 2024-09-28 ENCOUNTER — Other Ambulatory Visit: Payer: Self-pay

## 2024-10-28 ENCOUNTER — Other Ambulatory Visit: Payer: Self-pay

## 2024-11-03 ENCOUNTER — Ambulatory Visit
Admission: RE | Admit: 2024-11-03 | Discharge: 2024-11-03 | Disposition: A | Discharge status: Home / Self Care | Attending: Emergency Medicine | Admitting: Emergency Medicine

## 2024-11-03 VITALS — BP 143/84 | HR 96 | Temp 98.1°F | Resp 19 | Wt 165.8 lb

## 2024-11-03 DIAGNOSIS — J22 Unspecified acute lower respiratory infection: Secondary | ICD-10-CM

## 2024-11-03 DIAGNOSIS — H66002 Acute suppurative otitis media without spontaneous rupture of ear drum, left ear: Secondary | ICD-10-CM

## 2024-11-03 MED ORDER — ALBUTEROL SULFATE HFA 108 (90 BASE) MCG/ACT IN AERS: 2.0000 | INHALATION_SPRAY | RESPIRATORY_TRACT | 0 refills | Status: DC | PRN

## 2024-11-03 MED ORDER — AMOXICILLIN-POT CLAVULANATE 875-125 MG PO TABS: 1.0000 | ORAL_TABLET | Freq: Two times a day (BID) | ORAL | 0 refills | Status: AC

## 2024-11-03 MED ORDER — ALBUTEROL SULFATE HFA 108 (90 BASE) MCG/ACT IN AERS: 2.0000 | INHALATION_SPRAY | RESPIRATORY_TRACT | 0 refills | Status: AC | PRN

## 2024-11-03 NOTE — ED Triage Notes (Signed)
 Patient to Urgent Care with complaints of nasal congestion/ sinus pressure/ runny nose. Denies any known fevers.  Symptoms started Saturday. Reports today she feels [facility] w/ exertion.   Using dayquil/ nyquil and sudafed.

## 2024-11-03 NOTE — ED Provider Notes (Signed)
 " [facility]    CSN: [id number] Arrival date & time: 11/03/24  1553      History   Chief Complaint Chief Complaint  Patient presents with   URI    URI for several days with shortness of breath today - Entered by patient    HPI Tina Chaney is a 49 y.o. female.   49 year old female, Tina Chaney, presents to urgent care for evaluation of URI(nasal congestion sinus pressure runny nose shortness of breath with exertion x 5 days.  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  Pt has used Dayquil, Nyquil and sudafed  The history is provided by the patient. No language interpreter was used.  URI Presenting symptoms: congestion, cough and rhinorrhea   Presenting symptoms: no fever   Associated symptoms: sinus pain     Past Medical History:  Diagnosis Date   Dysautonomia (HCC)    GERD (gastroesophageal reflux disease) 1993   Migraines     Patient Active Problem List   Diagnosis Date Noted   Acute respiratory infection 11/03/2024   Acute suppurative otitis media of left ear without spontaneous rupture of tympanic membrane 11/03/2024   Migraines    Dysautonomia (HCC)    Nondisplaced fracture of lateral condyle of right tibia, subsequent encounter for closed fracture with routine healing 02/06/2020   Osteoarthritis of right patellofemoral joint 01/16/2020   Acute pain of right knee 01/12/2020   Patellofemoral dysfunction of right knee 07/14/2017   Acute left-sided low back pain without sciatica 06/16/2016   Tobacco abuse 02/03/2014   Chest pain 02/03/2014   Referral of patient 01/26/2014   SI (sacroiliac) joint dysfunction 04/28/2011   Migraine without aura and without status migrainosus, not intractable 04/28/2011   GERD (gastroesophageal reflux disease) 04/28/2011    Past Surgical History:  Procedure Laterality Date   ENDOMETRIAL ABLATION     TONSILLECTOMY      OB History   No obstetric history on file.       Home Medications    Prior to Admission medications  Medication Sig Start Date End Date Taking? Authorizing Provider  amoxicillin -clavulanate (AUGMENTIN ) 875-125 MG tablet Take 1 tablet by mouth every 12 (twelve) hours. 11/03/24  Yes Rhona Fusilier, Rilla, NP  albuterol  (VENTOLIN  HFA) 108 (90 Base) MCG/ACT inhaler Inhale 2 puffs into the lungs every 4 (four) hours as needed for wheezing or shortness of breath. 11/03/24   Kailani Brass, Rilla, NP  aspirin-acetaminophen -caffeine (EXCEDRIN MIGRAINE) 250-250-65 MG tablet Take by mouth every 6 (six) hours as needed for headache.    [provider]  Cetirizine HCl 10 MG CAPS Take by mouth.    [provider]  ivabradine  (CORLANOR) 5 MG TABS tablet Take 1 tablet (5 mg total) by mouth 2 (two) times daily. 03/02/24     ivabradine  (CORLANOR) 7.5 MG TABS tablet Take 1 tablet (7.5 mg total) by mouth 2 (two) times daily. 03/30/24     ivabradine  (CORLANOR) 7.5 MG TABS tablet Take 1 tablet (7.5 mg total) by mouth 2 (two) times daily with meals 05/05/24     nortriptyline  (PAMELOR ) 10 MG capsule Take 1 capsule (10 mg) by mouth at night for one week then increase to 2 capsules (20 mg) by mouth at night 07/28/24     nortriptyline  (PAMELOR ) 10 MG capsule Take 1 capsule (10 mg) by mouth at night for one week then increase to 2 capsules (20 mg) by mouth at night 09/23/24 12/02/24    ondansetron  (ZOFRAN )  4 MG tablet Take 1 tablet (4 mg total) by mouth every 8 (eight) hours as needed for nausea or vomiting. 12/11/23   Joshua Cathryne BROCKS, MD  ondansetron  (ZOFRAN ) 4 MG tablet Take 1 tablet (4 mg total) by mouth every 8 (eight) hours as needed for Nausea 05/05/24     propranolol  (INDERAL ) 10 MG tablet Take 1 tablet (10 mg total) by mouth 2 (two) times daily. Increase to max of 2 tablets (20 mg total) twice daily if needed. Keep HR >50 bpm. 01/26/24     SUMAtriptan  (IMITREX ) 100 MG tablet Take 1 tablet (100 mg total) by mouth every 2 (two) hours as needed for migraine.  May repeat in 2 hours if headache persists or recurs. 11/17/23   Joshua Cathryne BROCKS, MD  topiramate  (TOPAMAX ) 100 MG tablet Take 1 tablet (100 mg total) by mouth daily. 11/17/23   Joshua Cathryne BROCKS, MD  topiramate  (TOPAMAX ) 50 MG tablet Take 3 tablets (150 mg total) by mouth at bedtime. 05/05/24     traZODone  (DESYREL ) 50 MG tablet Take 1 tablet (50 mg total) by mouth Nightly as needed for sleep. If ineffective, try 2 tablets (100 mg total) nightly. 03/02/24     Ubrogepant  (UBRELVY ) 100 MG TABS Take 50-100mg  (1/2-1 tablet) at headache onset. Can repeat after 2 hours if needed. Do not exceed 200mg  (2 tablets) in 24 hours. 02/04/24     Ubrogepant  (UBRELVY ) 100 MG TABS Take 0.5 to 1 tablet (50-100mg ) by mouth as needed at headache onset. Can repeat after 2 hours if needed. Do not exceed 200mg  in 24 hours. 09/23/24       Family History Family History  Problem Relation Age of Onset   Diabetes Father    Renal Disease Father    Heart disease Father    Hypertension Father    Kidney disease Father    Obesity Father     Social History Social History[1]   Allergies   Patient has no known allergies.   Review of Systems Review of Systems  Constitutional:  Negative for fever.  HENT:  Positive for congestion, rhinorrhea, sinus pressure and sinus pain.   Respiratory:  Positive for cough and shortness of breath.   All other systems reviewed and are negative.    Physical Exam Triage Vital Signs ED Triage Vitals  Encounter Vitals Group     BP      Girls Systolic BP Percentile      Girls Diastolic BP Percentile      Boys Systolic BP Percentile      Boys Diastolic BP Percentile      Pulse      Resp      Temp      Temp src      SpO2      Weight      Height      Head Circumference      Peak Flow      Pain Score      Pain Loc      Pain Education      Exclude from Growth Chart    No data found.  Updated Vital Signs BP (!) 143/84   Pulse 96   Temp 98.1 F (36.7 C)   Resp 19   Wt 165 lb  12.8 oz (75.2 kg)   SpO2 100%   BMI 25.21 kg/m   Visual Acuity Right Eye Distance:   Left Eye Distance:   Bilateral Distance:    Right Eye Near:  Left Eye Near:    Bilateral Near:     Physical Exam Vitals and nursing note reviewed.  Constitutional:      General: She is not in acute distress.    Appearance: She is well-developed and well-groomed.  HENT:     Head: Normocephalic.     Right Ear: A middle ear effusion is present. Tympanic membrane is retracted.     Left Ear: Tympanic membrane is erythematous and bulging.     Nose: Mucosal edema and congestion present.     Mouth/Throat:     Lips: Pink.     Mouth: Mucous membranes are moist.     Pharynx: Oropharynx is clear. Postnasal drip present.  Eyes:     General: Lids are normal.     Conjunctiva/sclera: Conjunctivae normal.     Pupils: Pupils are equal, round, and reactive to light.  Neck:     Trachea: No tracheal deviation.  Cardiovascular:     Rate and Rhythm: Normal rate and regular rhythm.     Heart sounds: Normal heart sounds. No murmur heard. Pulmonary:     Effort: Pulmonary effort is normal.     Breath sounds: Normal breath sounds and air entry.  Abdominal:     General: Bowel sounds are normal.     Palpations: Abdomen is soft.     Tenderness: There is no abdominal tenderness.  Musculoskeletal:        General: Normal range of motion.     Cervical back: Normal range of motion.  Lymphadenopathy:     Cervical: No cervical adenopathy.  Skin:    General: Skin is warm and dry.     Findings: No rash.  Neurological:     General: No focal deficit present.     Mental Status: She is alert and oriented to person, place, and time.     GCS: GCS eye subscore is 4. GCS verbal subscore is 5. GCS motor subscore is 6.  Psychiatric:        Speech: Speech normal.        Behavior: Behavior normal. Behavior is cooperative.      UC Treatments / Results  Labs (all labs ordered are listed, but only abnormal results are  displayed) Labs Reviewed - No data to display  EKG   Radiology No results found.  Procedures Procedures (including critical care time)  Medications Ordered in UC Medications - No data to display  Initial Impression / Assessment and Plan / UC Course  I have reviewed the triage vital signs and the nursing notes.  Pertinent labs & imaging results that were available during my care of the patient were reviewed by me and considered in my medical decision making (see chart for details).     Discussed exam findings plan of care with patient : take Augmentin  as prescribed for ear infection/acute respiratory infection, albuterol  as prescribed Drink plenty of fluids, avoid caffeine If you develop chest pain, palpitations, or worsening symptoms go to the emergency room for further evaluation.  Patient verbalized understanding to this provider, offered work note patient declined.  Ddx: Acute left ear otitis media,  acute respiratory infection, viral illness, allergies Final Clinical Impressions(s) / UC Diagnoses   Final diagnoses:  Acute respiratory infection  Acute suppurative otitis media of left ear without spontaneous rupture of tympanic membrane, recurrence not specified     Discharge Instructions      Take Augmentin  as prescribed , albuterol  as prescribed Drink plenty of fluids, avoid caffeine If you  develop chest pain, palpitations, or worsening symptoms go to the emergency room for further evaluation     ED Prescriptions     Medication Sig Dispense Auth. Provider   amoxicillin -clavulanate (AUGMENTIN ) 875-125 MG tablet Take 1 tablet by mouth every 12 (twelve) hours. 14 tablet Flemon Kelty, NP   albuterol  (VENTOLIN  HFA) 108 (90 Base) MCG/ACT inhaler  (Status: Discontinued) Inhale 2 puffs into the lungs every 4 (four) hours as needed for wheezing or shortness of breath. 1 each Marino Rogerson, Rilla, NP   albuterol  (VENTOLIN  HFA) 108 (90 Base) MCG/ACT inhaler Inhale 2  puffs into the lungs every 4 (four) hours as needed for wheezing or shortness of breath. 1 each Nakeya Adinolfi, Rilla, NP      PDMP not reviewed this encounter.     [1]  Social History Tobacco Use   Smoking status: Some Days    Types: Cigarettes   Smokeless tobacco: Never  Vaping Use   Vaping status: Never Used  Substance Use Topics   Alcohol use: Never   Drug use: Never     Diany Formosa, Rilla, NP 11/03/24 1847  "

## 2024-11-03 NOTE — Discharge Instructions (Addendum)
 Take Augmentin  as prescribed , albuterol  as prescribed Drink plenty of fluids, avoid caffeine If you develop chest pain, palpitations, or worsening symptoms go to the emergency room for further evaluation

## 2024-11-14 ENCOUNTER — Other Ambulatory Visit: Payer: Self-pay

## 2024-11-16 ENCOUNTER — Other Ambulatory Visit: Payer: Self-pay

## 2024-11-18 ENCOUNTER — Other Ambulatory Visit (HOSPITAL_COMMUNITY): Payer: Self-pay
